# Patient Record
Sex: Male | Born: 1950 | Race: White | Hispanic: No | Marital: Married | Smoking: Former smoker
Health system: Southern US, Community
[De-identification: ages and names within clinical notes are randomized; demographics above are authoritative.]

## PROBLEM LIST (undated history)

## (undated) DIAGNOSIS — G4730 Sleep apnea, unspecified: Secondary | ICD-10-CM

## (undated) DIAGNOSIS — E669 Obesity, unspecified: Secondary | ICD-10-CM

## (undated) DIAGNOSIS — I10 Essential (primary) hypertension: Secondary | ICD-10-CM

## (undated) DIAGNOSIS — I4891 Unspecified atrial fibrillation: Secondary | ICD-10-CM

## (undated) DIAGNOSIS — E785 Hyperlipidemia, unspecified: Secondary | ICD-10-CM

## (undated) DIAGNOSIS — D126 Benign neoplasm of colon, unspecified: Secondary | ICD-10-CM

## (undated) HISTORY — DX: Essential (primary) hypertension: I10

## (undated) HISTORY — DX: Unspecified atrial fibrillation: I48.91

## (undated) HISTORY — DX: Obesity, unspecified: E66.9

## (undated) HISTORY — DX: Sleep apnea, unspecified: G47.30

## (undated) HISTORY — PX: APPENDECTOMY: SHX54

## (undated) HISTORY — PX: COLONOSCOPY: SHX174

## (undated) HISTORY — DX: Hyperlipidemia, unspecified: E78.5

## (undated) HISTORY — DX: Benign neoplasm of colon, unspecified: D12.6

---

## 1997-12-29 ENCOUNTER — Ambulatory Visit (HOSPITAL_COMMUNITY): Admission: RE | Admit: 1997-12-29 | Discharge: 1997-12-29 | Payer: Self-pay | Admitting: Urology

## 2002-03-21 ENCOUNTER — Encounter: Payer: Self-pay | Admitting: Internal Medicine

## 2002-03-21 ENCOUNTER — Ambulatory Visit (HOSPITAL_COMMUNITY): Admission: RE | Admit: 2002-03-21 | Discharge: 2002-03-21 | Payer: Self-pay | Admitting: Internal Medicine

## 2002-03-24 ENCOUNTER — Encounter: Admission: RE | Admit: 2002-03-24 | Discharge: 2002-03-24 | Payer: Self-pay | Admitting: Urology

## 2002-03-24 ENCOUNTER — Encounter: Payer: Self-pay | Admitting: Urology

## 2002-03-27 ENCOUNTER — Ambulatory Visit (HOSPITAL_BASED_OUTPATIENT_CLINIC_OR_DEPARTMENT_OTHER): Admission: RE | Admit: 2002-03-27 | Discharge: 2002-03-27 | Payer: Self-pay | Admitting: Urology

## 2002-03-31 ENCOUNTER — Encounter: Payer: Self-pay | Admitting: Urology

## 2002-03-31 ENCOUNTER — Encounter: Admission: RE | Admit: 2002-03-31 | Discharge: 2002-03-31 | Payer: Self-pay | Admitting: Urology

## 2005-03-25 ENCOUNTER — Encounter (INDEPENDENT_AMBULATORY_CARE_PROVIDER_SITE_OTHER): Payer: Self-pay | Admitting: *Deleted

## 2005-03-25 ENCOUNTER — Inpatient Hospital Stay (HOSPITAL_COMMUNITY): Admission: EM | Admit: 2005-03-25 | Discharge: 2005-03-27 | Payer: Self-pay | Admitting: Emergency Medicine

## 2006-12-17 IMAGING — CR DG ABDOMEN ACUTE W/ 1V CHEST
4 series · 4 of 4 positions shown · non-contrast
Comparison: none

CLINICAL DATA: Right lower quadrant pain.
 ABDOMEN - 2 VIEW & CHEST - 1 VIEW: 
 The chest x-ray shows mild bibasilar atelectasis.  The lungs are otherwise clear.  
 Flat and upright views of the abdomen reveal a normal bowel gas pattern with no obstruction or free air.  An undissolved tablet is noted in the colon.  There are no renal calculi.

[w chest pa]
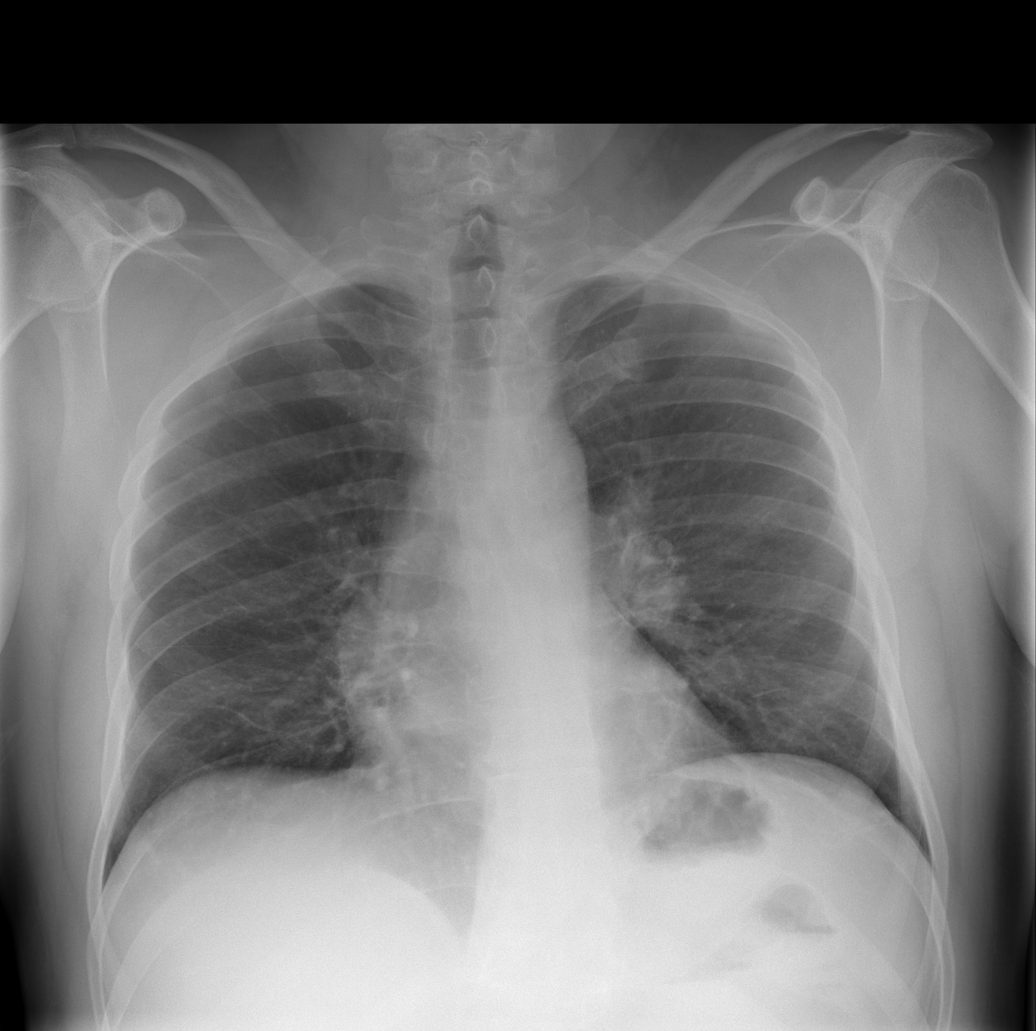

[w abdomen upright *]
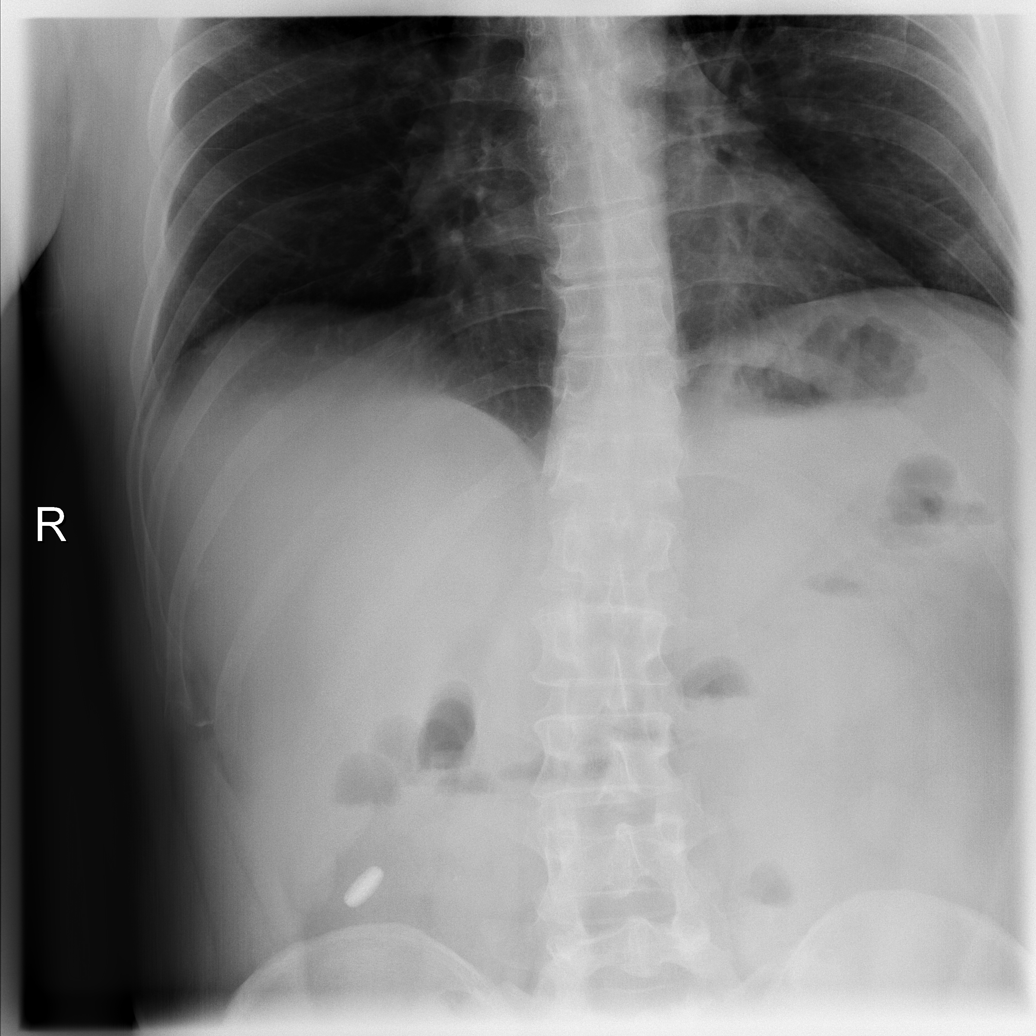

[t abdomen supine (1 of 2)]
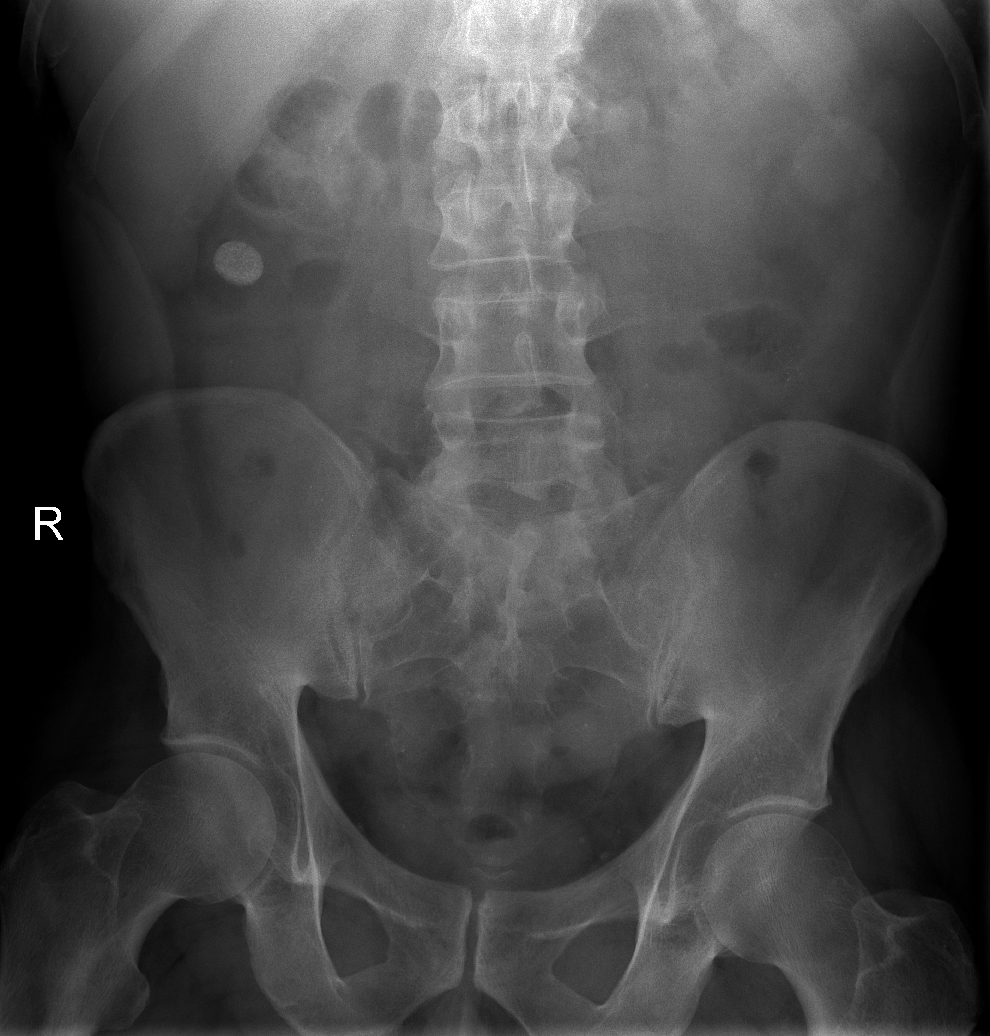

[t abdomen supine (2 of 2)]
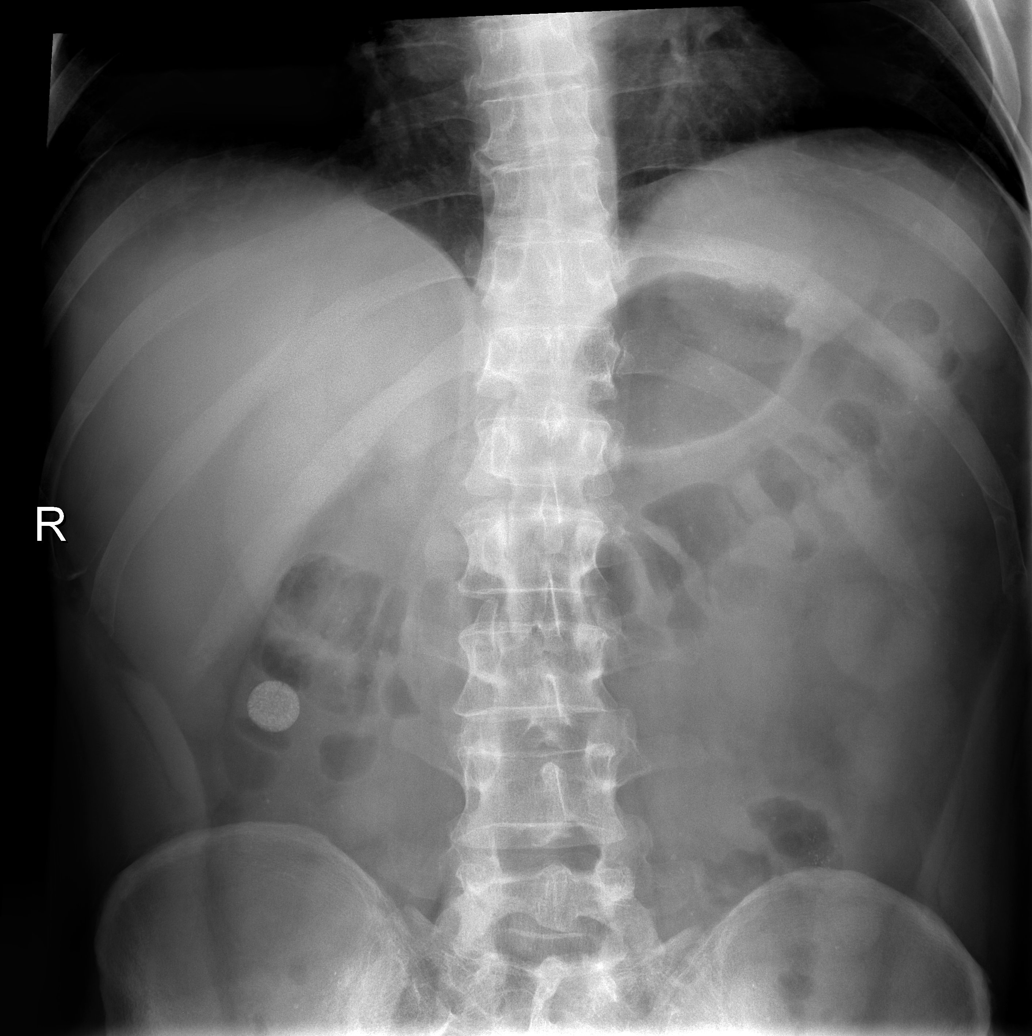

[4 of 4 positions shown; findings below may reference images not displayed]

IMPRESSION: Mild bibasilar atelectasis.  No acute abnormality.

## 2007-08-06 ENCOUNTER — Ambulatory Visit: Payer: Self-pay | Admitting: Cardiology

## 2007-08-06 ENCOUNTER — Emergency Department (HOSPITAL_COMMUNITY): Admission: EM | Admit: 2007-08-06 | Discharge: 2007-08-06 | Payer: Self-pay | Admitting: Emergency Medicine

## 2007-08-13 ENCOUNTER — Ambulatory Visit: Payer: Self-pay

## 2007-08-23 ENCOUNTER — Ambulatory Visit: Payer: Self-pay | Admitting: Cardiology

## 2009-04-29 IMAGING — CR DG CHEST 1V PORT
1 series · 1 of 1 positions shown · non-contrast
Comparison: None.

CLINICAL DATA: Chest pain.
 PORTABLE CHEST ? 1 VIEW:

[view not recorded]
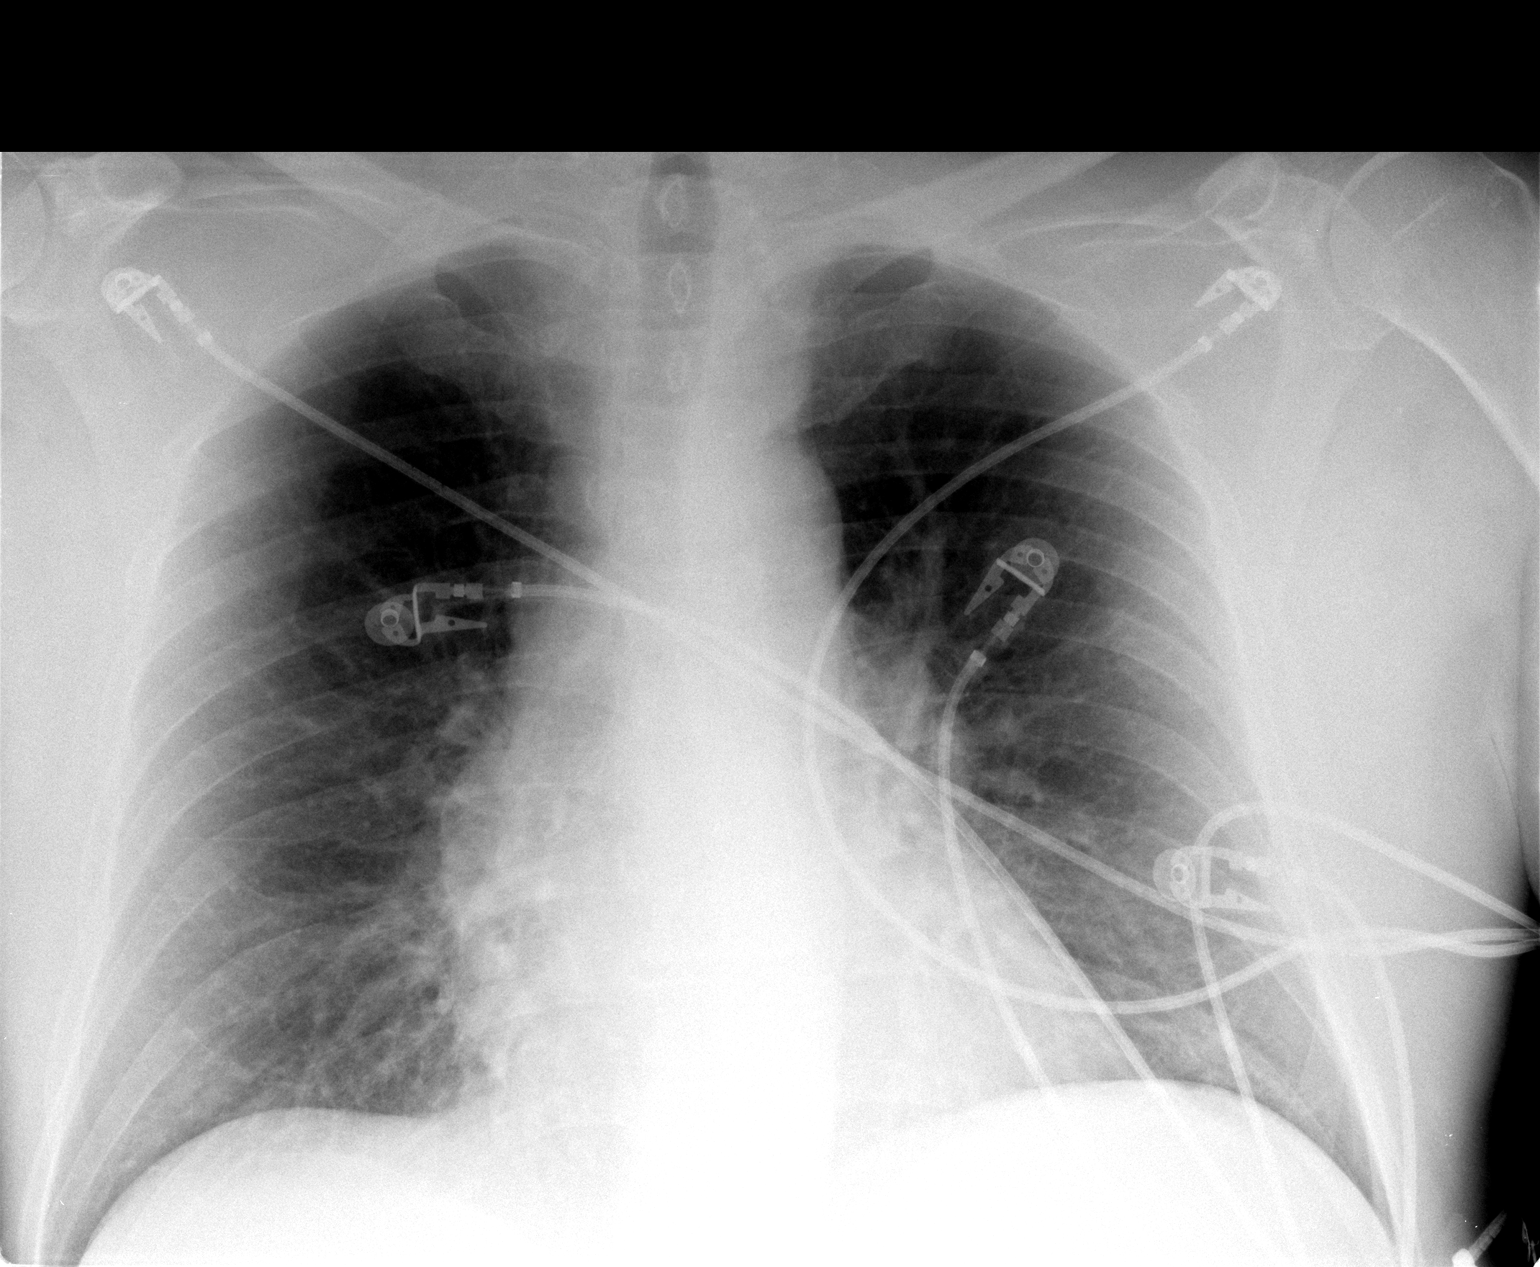

[1 of 1 positions shown; findings below may reference images not displayed]

FINDINGS: Heart and mediastinum are within normal limits for portable technique.  No congestive heart failure.  Lungs are clear.  Osseous structures are intact in one view.
IMPRESSION: No active disease.

## 2011-01-24 NOTE — Consult Note (Signed)
NAME:  Michael Buck, Michael Buck NO.:  [id number]   MEDICAL RECORD NO.:  [id number]          PATIENT TYPE:  EMS   LOCATION:  MAJO                         FACILITY:  [facility]   PHYSICIAN:  Luis Abed, MD, FACCDATE OF BIRTH:  1951/05/28   DATE OF CONSULTATION:  DATE OF DISCHARGE:                                 CONSULTATION   REVIEW OF HISTORY:  Michael Buck is a 60 year old white male who was  referred to [facility] Emergency Room secondary to chest discomfort.   On Sunday morning, at approximately 8 o'clock while reaching to put a  cup of coffee in the microwave, he suddenly developed sharp left  anterior upper chest discomfort, radiating to his left shoulder blade  and left arm.  He denied shortness of breath, but he stated it was  difficulty to take a deep breath.  He specifically denied any nausea,  vomiting or diaphoresis.  Initially, it was a 10 on a scale of 0 to 10.  He sat down and restarted and within less than 5 minutes, the discomfort  was a 3 and he continued on with his usual activities over the next  several days.  The discomfort has been persistent since Sunday morning,  although times he is unaware of the discomfort because he does not pay  any attention to it, but he thinks overall the last time his discomfort  was a 0 was prior to onset on Sunday.  He does notice residual  discomfort with moving his upper extremities in the left shoulder blade.  He did take an aspirin on Sunday evening and did not notice any specific  changes.  He denies prior occurrences.   ALLERGIES:  NO KNOWN DRUG ALLERGIES.   MEDICATIONS PRIOR TO ADMISSION:  Did not include any prescriptions.   PAST MEDICAL HISTORY:  Notable for no prior cardiac evaluation.  He  specifically denies any hypertension, diabetes, chronic obstructive  pulmonary disease, cerebrovascular accident, myocardial infarction,  blood dyscrasia, thyroid dysfunction and he does not know his  cholesterol  status.   PAST SURGICAL HISTORY:  Notable for an appendectomy.   SOCIAL HISTORY:  He resides in [facility] [place] [facility] with his wife.  They have 1  child, no grandchildren.  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  He smokes 1-1/2 packs per day for the  past 40 years.  Denies any alcohol, drugs, herbal medications, specific  diet or exercise program.   FAMILY HISTORY:  His mother died at the age of 35 with a questionable  history of myocardial infarction, definite history of CVA.  His father  died at age 51 with an MI, history of hypertension and diabetes.  He has  1 brother deceased at age 59, secondary to suicide.  Sister is deceased  at age 68 with cancer.  He has 1 brother, 69, with a history of  hypertension.   REVIEW OF HISTORY:  In addition to the above, is notable for reading  glasses, hearing loss although he has not had any testing and he does  not wear hearing  aides, dentures, cough productive of brownish sputum,  wheezing, he does snore he has never been evaluated for sleep apnea.  All other systems unremarkable, except as noted above.   PHYSICAL EXAMINATION:  GENERAL:  Well-nourished, well-developed,  pleasant white male in no apparent distress.  Wife is present.  Also,  boss is present.  VITAL SIGNS:  Temperature 97.1.  Blood pressure 144/79.  Pulse 55.  Respirations 14, 98% saturation on room air.  HEENT:  Unremarkable, except for edentulous.  NECK:  Supple without thyromegaly, adenopathy, JVD or carotid bruits.  CHEST:  Symmetrical excursion.  Clear to auscultation without rales,  rhonchi or wheezing.  He does have diminished breath sounds throughout.  HEART:  PMI is not displaced.  Regular rate and rhythm.  I did not  appreciate any murmurs, rubs, clicks or gallops.  All pulses are  symmetrical and intact without abdominal or femoral bruits.  Skin  integument was intact.  ABDOMEN:  Obese.  Bowel sounds present.  Nontender without  organomegaly  or masses.  EXTREMITIES:  Negative cyanosis, clubbing or edema.  MUSCULOSKELETAL:  Unremarkable, except his discomfort is reproduced in  the left shoulder blade with extension of both arms.  NEURO:  Unremarkable.   LABORATORY DATA:  Chest x-ray shows no active disease.  EKG shows normal  sinus rhythm, left axis deviation.  No acute changes.  No apparent  change from old EKG in July of 2008.  H&H is 15.3 and 45.3.  normal  indices.  Platelets 266.  WBC 11.5.  PT 12.8, PTT 23.  Sodium 137,  potassium 3.8, BUN 13, creatinine 0.78, glucose 92.  Normal LFTs.  CK-MB  is 104 and 2.0 and troponins 0.01.  Hemoglobin A1c and TSH are pending  at the time of this dictation.   IMPRESSION:  1. Prolonged atypical chest discomfort.  2. Hypertension.  As initial presentation to urgent care clinic showed      a blood pressure of 179/97; however, now improved.  3. Tobacco use.  4. Obesity.  5. Probable hyperlipidemia.   DISPOSITION:  Dr. Myrtis Ser reviewed the patient's history, spoke with and  examined the patient.  Given his EKG, negative enzymes with a 3 day  history of consistent chest discomfort, it was felt that his discomfort  is not cardiac in etiology.  We have arranged an outpatient stress  Myoview for August 13, 2007 at 9:15 and follow up with Dr. Myrtis Ser on  August 21, 2007 at 11:45.  I have asked him to begin a blood pressure  diary for continuing monitoring, as well as begin a baby aspirin 81 mg  daily.  We have counseled him on regular exercise, to follow a low-salt,  low-fat diet and tobacco cessation.  He will be discharged from the  emergency room with the above-mentioned followup.      Joellyn Rued, PA-C      Luis Abed, MD, [facility]  Electronically Signed    EW/MEDQ  D:  08/06/2007  T:  08/07/2007  Job:  [id number]   cc:   Harrel Lemon. Merla Riches, M.D.

## 2011-01-24 NOTE — Assessment & Plan Note (Signed)
[facility]                          [facility] NOTE   NAME:Michael Buck, Michael Buck                      MRN:          [id number]  DATE:08/23/2007                            DOB:          July 18, 1951    I saw Michael Buck for cardiology consultation at [facility]. He  had been sent to [place] from Dr. Merla Riches at urgent care. The patient had  chest discomfort. There is a complete consultation note in the chart.  His blood pressure was elevated when he was first seen. It improved once  he was at the hospital. He smokes and continues to smoke and we talked  about this. He is overweight.   In the hospital, there was no evidence of MI. We arranged for him to  have an outpatient stress test on 08/13/2007 that was done in the  [place] office. The patient exercised 9 minutes and 15 seconds. He  did have some chest tightness. He did have a hypertensive blood pressure  response. Ejection fraction was normal. There was no sign of scar or  ischemia.   Michael Buck returns today and he is feeling well. He is not having any  recurring problems.   PAST MEDICAL HISTORY:   ALLERGIES:  No known drug allergies.   MEDICATIONS:  1. Aspirin.  2. Multivitamin.   OTHER MEDICAL PROBLEMS:  See the list below.   REVIEW OF SYSTEMS:  He feels well today and his review of systems is  negative. He is not having any recurrent chest pain. Otherwise, his  review of systems is negative.   PHYSICAL EXAMINATION:  VITAL SIGNS:  Blood pressure 150/66, pulse 66,  weight 232 pounds.  GENERAL:  The patient is oriented to person, time, and place. Affect is  normal.  HEENT:  Reveals no xanthelasma. He has normal extraocular motion. There  are no carotid bruits. There is no jugular venous distension.  CARDIAC:  S1 with an S2.  EXTREMITIES:  He has no peripheral edema.   No other labs are done today.   I discussed the situation carefully with Michael Buck. I spoke with  him  specifically about stopping smoking. He said that he will try to cut  down, but he did not commit to anything else. He needs careful follow up  of his blood pressure. I was prepared to begin a blood pressure medicine  today, as I do believe that he needs blood pressure treated. He was  somewhat hesitant and would like to follow along with Dr. Merla Riches.   PROBLEMS:  1. Chest discomfort. No proof of coronary ischemia. A Cardiolite      Myoview scan showing no ischemia.  2. Mild hypertension with a hypertensive response to stress. I do      think that he should have his blood pressure followed and if it      remains elevated, it should be treated.  3. Tobacco use. He is strongly encouraged to stop smoking.  4. Increased weight. Weight loss would be optimal and follow up of his      lipids would be optimal.  I have encouraged him to see Dr. Merla Riches for follow up. No further  formal cardiac workup at this time.     Luis Abed, MD, [facility]  Electronically Signed    JDK/MedQ  DD: 08/23/2007  DT: 08/24/2007  Job #: [id number]   cc:   Harrel Lemon. Merla Riches, M.D.

## 2011-01-27 NOTE — Op Note (Signed)
[facility]  Patient:    Michael Buck, Michael Buck Visit Number: [id number] MRN: [id number]          Service Type: NES Location: NESC Attending Physician:  Laqueta Jean Dictated by:   Vonzell Schlatter Patsi Sears, M.D. Proc. Date: 03/27/02 Admit Date:  03/27/2002 Discharge Date: 03/27/2002                             Operative Report  PREOPERATIVE DIAGNOSIS:  Left ureterocolic.  POSTOPERATIVE DIAGNOSIS:  Left ureterocolic.  OPERATION PERFORMED:  Cystourethroscopy, left retrograde pyelogram, balloon dilation of the left lower ureter, ureteroscopy basket extraction of a left mid ureteral stone.  SURGEON:  Sigmund I. Patsi Sears, M.D.  ANESTHESIA:  General (LMA).  PREPARATION:  After appropriate preanesthesia, the patient was brought to the operating room, placed on the operating table in the dorsal supine position where general LMA anesthesia was introduced. He was then replaced in the dorsal lithotomy position where the pubis was prepped with Betadine solution and draped in the usual fashion.  DESCRIPTION OF PROCEDURE:  Cystourethroscopy was accomplished. Review of the patients x-rays showed that the patient had possible phleboliths versus possible left lower ureteral calculus. The patient has had no relief of his left flank and left lower quadrant pain. Therefore, he is for ureteroscopy and basket extraction of possible stone.  Cystoscopy revealed a normal-appearing bladder, with a very small left ureteral orifice. A guidewire was passed into the ureteral orifice, and a 4 cm balloon dilation was accomplished, with retrograde pyelography, showing that the two pelvic calcifications appear to be outside the ureter. Direct vision ureteroscopy was then accomplished with the 6.5 [place] scope, and indeed, the calcifications seen on KUB were phleboliths. However, ureteroscopy more proximal, in the mid ureter, showed an area of tight narrowing,  and manipulation showed that a stone, which had grown into the wall. This was unroofed and excised in pieces. The stone did not appear on retrograde pyelogram at all, and the dark colored stone appeared quartzlike when excised. It will be sent for analysis.  It was elected to leave a double J catheter in place, and a 6 x 26 double J catheter was then passed over the guidewire into the renal pelvis, coiled in the renal pelvis and in the bladder. The patient tolerated the procedure well, and was awakened and taken to the recovery room in good condition. It is noted that the patient received Xylocaine within the ureter, as well as IV Toradol, and at the beginning of the case, had a B&O suppository. Dictated by:   Vonzell Schlatter Patsi Sears, M.D. Attending Physician:  Laqueta Jean DD:  03/27/02 TD:  04/01/02 Job: [id number]

## 2011-01-27 NOTE — H&P (Signed)
NAME:  Michael Buck, TATSCH NO.:  [id number]   MEDICAL RECORD NO.:  [id number]          PATIENT TYPE:  INP   LOCATION:  0101                         FACILITY:  [facility]   PHYSICIAN:  Anselm Pancoast. Weatherly, M.D.DATE OF BIRTH:  [birth year]   DATE OF ADMISSION:  03/25/2005  DATE OF DISCHARGE:                                HISTORY & PHYSICAL   CHIEF COMPLAINT:  Severe abdominal pain, right side.   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 He went to bed last night and  started having kind of indigestion, definitely pain about early a.m. Got up  and took some Pepto Bismol and tried to take some stuff to soothe his  stomach. Felt somewhat better and then shortly afterwards, was obviously  feeling worse. He went to [facility] and seen by Dr. Merla Riches who  found him significantly tender on the abdomen, predominantly the right lower  quadrant with a mildly elevated temperature. White count was 25,600 and a  urinalysis was essentially negative with a few red cells. The patient has  had previous kidney stones but no other serious problem and Dr. Merla Riches  called me. This was about probably 6:00 and said that he thought the patient  obviously had appendicitis and did not think that the patient needed a CT. I  said send him on over to the emergency room and I saw him. I was in  agreement on his physical examination. He was definitely very tender in the  lower right abdomen and did obtain an acute abdomen plain series, just to  make sure that we did not have any free air. He was markedly tender with  muscle guarding in the right lower abdomen and really denied having any  definite symptoms of abdominal pain yesterday. I had started an IV and given  him 3 grams of Unasyn and discussed with him, that hopefully this was acute  appendicitis, possibly ruptured and we discussed the options that we may  have a problem with diverticulitis. He said that the pain he originally  noted yesterday night, really was kind of more generalized, upper abdomen  and then shifted to the lower abdomen this morning. He was tender, kind of  right at the anterior iliac crest area and hopefully, this is a retrocecal  appendix.   REVIEW OF SYSTEMS:  No hypertension. No history of angina. I am not sure  when he has had a last chest x-ray.   ALLERGIES:  Denies allergies.   PAST SURGICAL HISTORY:  He has had the kidney stones.   PHYSICAL EXAMINATION:  GENERAL:  He is a husky, healthy appearing male who  appears very uncomfortable because of the pain.  VITAL SIGNS:  At the urgent care, he had a low grade temperature and here,  his temperature was 101.3, pulse was 85 and respiratory rate 24.  HEENT:  He appears adequately hydrated.  LUNGS:  Good breath sounds bilaterally.  CARDIAC:  Normal sinus rhythm.  ABDOMEN:  He has got decreased bowel sounds with muscle guarding in the  right lower quadrant with rebound. He is not really tender in the upper  abdomen. It is predominantly left. Vaguely tender in the left lower  quadrant.  RECTAL/PELVIC:  Did not do an examination on him.  GENITOURINARY:  He has had no symptoms of burning, stinging, or difficulty  with his urine.  EXTREMITIES:  Unremarkable.  NEUROLOGIC:  Physiologic.   IMPRESSION/PLAN:  Acute surgical abdomen. Hopefully this is appendicitis  with fear it might be ruptured and we discussed the options of possible  diverticulitis. I plan on proceeding on with an urgent appendectomy, prefer  open.       WJW/MEDQ  D:  03/25/2005  T:  03/25/2005  Job:  [id number]   cc:   Harrel Lemon. Merla Riches, M.D.  [address]  [place]  [place] [address]  Fax: [phone]

## 2011-01-27 NOTE — Op Note (Signed)
NAME:  Michael Buck NO.:  [id number]   MEDICAL RECORD NO.:  [id number]          PATIENT TYPE:  INP   LOCATION:  1421                         FACILITY:  [facility]   PHYSICIAN:  Anselm Pancoast. Weatherly, M.D.DATE OF BIRTH:  [birth year]   DATE OF PROCEDURE:  03/25/2005  DATE OF DISCHARGE:                                 OPERATIVE REPORT   PREOPERATIVE DIAGNOSIS:  Acute appendicitis, possibly ruptured.   POSTOPERATIVE DIAGNOSIS:  Acute retrocecal gangrenous appendicitis, don't  think it is ruptured.   ANESTHESIA:  General.   SURGEON:  Consuello Bossier, M.D.   HISTORY:  Michael Buck is a 60 year old male who said he felt fine  yesterday and then last night started having kind of indigestion, more  severe during the evening/night and then fairly marked pain this morning and  it became worse during the day with shifting to the right lower quadrant.  He was seen in Urgent Care by Dr. Merla Riches who thought this was an  appendicitis.  His white count was 25,000 and he is febrile.  He is very  tender in the right lower quadrant.  I did an acute abdominal series that  did not show any free air.  You could see where he has had some Pepto-Bismol  tablets and hope this is an appendicitis; fear it may be ruptured.  The  patient has given 3 g of Unasyn.  He has had a little fluid bolus and is  going to undergo appendectomy.   The patient was taken to the operative suite, induction of general  anesthesia, general endotracheal tube and the abdomen was prepped with  Betadine solution and draped in  a sterile manner.  I made a little  transverse [place] incision just right at the anterior iliac crest where  his pain was most localized.  Sharp dissection and through the adipose  tissue, the external oblique, internal oblique and then carefully entered  into the peritoneal cavity.  There was a little bit of turbid fluid in this  area.  You could feel an inflammatory mass right  behind the edge of the  cecum and you could see part of the appendix.  I had the nurse pull kind of  vigorous on the retractors so I could kind of open up on the peritoneum a  little laterally and try to work this gangrenous appendix up into the field.  The appendiceal mesentery was divided between [facility] and ligated with 2-0  Vicryl.  The appendix was inflamed markedly right on down to its base.  Across the base with a right-angle, we tied it with 2-0 Vicryl and then  removed the gangrenous appendix.  The pursestring suture with 3-0 silk was  placed.  The first tie looked like it was not holding well and I put a  second tie right under it removing the first one.  This was with 2-0 Vicryl.  The appendiceal stump was inverted, pursestring suture tied and I put a Z-  stitch on top for kind of reinforcing the little pursestring suture closure.  Next, the area was thoroughly irrigated.  There  was no evidence of bleeding.  The cecum of course had not been brought up to the skin level.  Then the  wound was closed after correct sponge count.  The peritoneum and transverse  cystitis and running 2-0 Vicryl.  The internal blade was approximated with  interrupted 2-0 Vicryl and the external oblique was closed with interrupted  2-0 Vicryl.  The Scarpa's fascia was irrigated and closed with a few sutures  of 4-0 Vicryl and a couple 4-0 Vicryl sutures with Benzoin and Steri-Strips  of the skin.  He has about a 2 to  2-1/2 inch incision and I am planning on keeping him on antibiotics for  probably about 24 hours.  I will repeat a white count and mid-morning  tomorrow if he still has a markedly elevated white count and feels well  enough to go home I will probably place him on oral antibiotics for a few  days.       WJW/MEDQ  D:  03/25/2005  T:  03/26/2005  Job:  [id number]   cc:   Dr. Merla Riches

## 2011-06-20 LAB — CBC
HCT: 45.3
Hemoglobin: 15.3
MCHC: 33.8
MCV: 90.4
Platelets: 266
RBC: 5.01
RDW: 13.2
WBC: 11.5 — ABNORMAL HIGH

## 2011-06-20 LAB — COMPREHENSIVE METABOLIC PANEL
ALT: 18
AST: 19
Albumin: 3.7
Alkaline Phosphatase: 62
GFR calc Af Amer: >60
Glucose, Bld: 92
Potassium: 3.8
Sodium: 137
Total Protein: 6.4

## 2011-06-20 LAB — HEMOGLOBIN A1C: Hgb A1c MFr Bld: 5.7

## 2011-06-20 LAB — TSH: TSH: 3.561

## 2011-06-20 LAB — TROPONIN I: Troponin I: 0.01

## 2011-06-20 LAB — PROTIME-INR: INR: 0.9

## 2011-06-20 LAB — CK TOTAL AND CKMB (NOT AT ARMC): CK, MB: 2

## 2011-06-20 LAB — APTT: aPTT: 33

## 2011-07-24 ENCOUNTER — Encounter: Payer: Self-pay | Admitting: Gastroenterology

## 2011-08-14 ENCOUNTER — Ambulatory Visit (INDEPENDENT_AMBULATORY_CARE_PROVIDER_SITE_OTHER): Payer: BC Managed Care – PPO | Admitting: Gastroenterology

## 2011-08-14 ENCOUNTER — Encounter: Payer: Self-pay | Admitting: Gastroenterology

## 2011-08-14 VITALS — BP 136/72 | HR 60 | Ht 72.0 in | Wt 257.0 lb

## 2011-08-14 DIAGNOSIS — R195 Other fecal abnormalities: Secondary | ICD-10-CM

## 2011-08-14 MED ORDER — PEG-KCL-NACL-NASULF-NA ASC-C 100 G PO SOLR: 1.0000 | Freq: Once | ORAL | Status: DC

## 2011-08-14 NOTE — Patient Instructions (Addendum)
You have been scheduled for a Colonoscopy. See separate instructions.  Pick up your prep kit from your pharmacy.  cc: Ellamae Sia, MD

## 2011-08-14 NOTE — Progress Notes (Signed)
History of Present Illness: This is a 60 year old male who was found to have a Hemosure positive stools. He relates very rare episodes of postprandial reflux. He is also noted occasional hemorrhoidal symptoms maybe occurring once every few years. A CBC obtained in October was unremarkable. Denies weight loss, abdominal pain, constipation, diarrhea, change in stool caliber, melena, hematochezia, nausea, vomiting, dysphagia,chest pain.    No Known Allergies No outpatient prescriptions prior to visit.   Past Medical History  Diagnosis Date  . Hypertension   . Obesity    Past Surgical History  Procedure Date  . Appendectomy    History   Social History  . Marital Status: Married    Spouse Name: N/A    Number of Children: 1  . Years of Education: N/A   Occupational History  . Electrician     Engineer, drilling    Social History Main Topics  . Smoking status: Former Games developer  . Smokeless tobacco: Never Used  . Alcohol Use: Yes     occ beer   . Drug Use: No  . Sexually Active: None   Other Topics Concern  . None   Social History Narrative   Daily caffeine    Family History  Problem Relation Age of Onset  . Stroke Mother   . Hypertension Father   . Diabetes Father   . Heart attack Father   . Colon cancer Neg Hx     Review of Systems: Pertinent positive and negative review of systems were noted in the above HPI section. All other review of systems were otherwise negative.   Physical Exam: General: Well developed , well nourished, no acute distress Head: Normocephalic and atraumatic Eyes:  sclerae anicteric, EOMI Ears: Normal auditory acuity Mouth: No deformity or lesions Neck: Supple, no masses or thyromegaly Lungs: Clear throughout to auscultation Heart: Regular rate and rhythm; no murmurs, rubs or bruits Abdomen: Soft, non tender and non distended. No masses, hepatosplenomegaly or hernias noted. Normal Bowel sounds Rectal: Deferred to colonoscopy  Musculoskeletal:  Symmetrical with no gross deformities  Skin: No lesions on visible extremities Pulses:  Normal pulses noted Extremities: No clubbing, cyanosis, edema or deformities noted Neurological: Alert oriented x 4, grossly nonfocal Cervical Nodes:  No significant cervical adenopathy Inguinal Nodes: No significant inguinal adenopathy Psychological:  Alert and cooperative. Normal mood and affect  Assessment and Recommendations:  1. Asymptomatic Hemoccult-positive stool. Rule out colorectal lesions such as neoplasms and AVMs. The risks, benefits, and alternatives to colonoscopy with possible biopsy and possible polypectomy were discussed with the patient and they consent to proceed.

## 2011-09-11 ENCOUNTER — Telehealth: Payer: Self-pay | Admitting: Gastroenterology

## 2011-09-11 DIAGNOSIS — R195 Other fecal abnormalities: Secondary | ICD-10-CM

## 2011-09-11 MED ORDER — PEG-KCL-NACL-NASULF-NA ASC-C 100 G PO SOLR: 1.0000 | Freq: Once | ORAL | Status: DC

## 2011-09-11 NOTE — Telephone Encounter (Signed)
Resent Movi prep to [facility] in [place].

## 2011-09-20 ENCOUNTER — Telehealth: Payer: Self-pay | Admitting: *Deleted

## 2011-09-20 NOTE — Telephone Encounter (Signed)
Pt is calling to say that he mixed prep today 1/9.  Procedure scheduled for 1/11.  Pt will come by office and pick up MoviPrep sample. Michael Buck

## 2011-09-22 ENCOUNTER — Encounter: Payer: Self-pay | Admitting: Gastroenterology

## 2011-09-22 ENCOUNTER — Ambulatory Visit (AMBULATORY_SURGERY_CENTER): Payer: BC Managed Care – PPO | Admitting: Gastroenterology

## 2011-09-22 VITALS — BP 141/78 | HR 60 | Temp 97.3°F | Resp 17 | Ht 72.0 in | Wt 257.0 lb

## 2011-09-22 DIAGNOSIS — D126 Benign neoplasm of colon, unspecified: Secondary | ICD-10-CM

## 2011-09-22 DIAGNOSIS — R195 Other fecal abnormalities: Secondary | ICD-10-CM

## 2011-09-22 MED ORDER — SODIUM CHLORIDE 0.9 % IV SOLN: 500.0000 mL | INTRAVENOUS | Status: DC

## 2011-09-22 NOTE — Op Note (Addendum)
 [facility] [address] [place], [place]  [address]  COLONOSCOPY PROCEDURE REPORT  PATIENT:  Michael Buck, Michael Buck  MR#:  [id number] BIRTHDATE:  1951/04/02, 60 yrs. old  GENDER:  male ENDOSCOPIST:  Judie Petit T. Russella Dar, MD, [facility] Referred by:  Ellamae Sia, M.D. PROCEDURE DATE:  09/22/2011 PROCEDURE:  Colonoscopy with hot biopsy and snare polypectomy ASA CLASS:  Class II INDICATIONS:  1) heme positive stool MEDICATIONS:   These medications were titrated to patient response per physician's verbal order, Fentanyl 75 mcg IV, Versed 9 mg IV DESCRIPTION OF PROCEDURE:   After the risks benefits and alternatives of the procedure were thoroughly explained, informed consent was obtained.  Digital rectal exam was performed and revealed no abnormalities.   The [id number] endoscope was introduced through the anus and advanced to the cecum, which was identified by both the appendix and ileocecal valve, without limitations.  The quality of the prep was good, using MoviPrep. The instrument was then slowly withdrawn as the colon was fully examined. <<PROCEDUREIMAGES>> FINDINGS:  A sessile polyp was found in the ascending colon. It was 6 mm in size. Polyp was snared without cautery. Retrieval was successful. A sessile polyp was found in the mid transverse colon. It was 7 mm in size. Polyp was snared without cautery. Retrieval was successful. Two polyps were found in the sigmoid colon. They were 4 - 5 mm in size. With hot biopsy forceps, the polyps were cauterized and biopsies were obtained and sent to pathology.  A sessile polyp was found in the rectum. It was 5 mm in size. Polyp was snared without cautery. Retrieval was successful.  Otherwise normal colonoscopy without other polyps, masses, vascular ectasias, or inflammatory changes.  Retroflexed views in the rectum revealed no abnormalities.  The time to cecum =  1.5 minutes. The scope was then withdrawn (time =  12.5  min) from  the patient and the procedure completed.  COMPLICATIONS:  None  ENDOSCOPIC IMPRESSION: 1) 6 mm sessile polyp in the ascending colon 2) 7 mm sessile polyp in the mid transverse colon 3) 4 - 5 mm Two polyps in the sigmoid colon 4) 5 mm sessile polyp in the rectum  RECOMMENDATIONS: 1) Hold aspirin, aspirin products, and anti-inflammatory medication for 2 weeks. 2) Await pathology results 3) If 3 or more of the polyps are adenomatous (pre-cancerous), colonoscopy in 3 years. If 1 or 2 are adenomatous, colonoscopy in 5 years. Otherwise you should continue to follow colorectal cancer screening guidelines for "routine risk" patients with a colonoscopy in 10 years.  Venita Lick. Russella Dar, MD, [facility]  n. REVISED:  09/22/2011 10:07 AM eSIGNED:   Venita Lick. Pheng Prokop at 09/22/2011 10:07 AM  Theola Sequin, [phone]

## 2011-09-22 NOTE — Patient Instructions (Signed)
Please follow discharge instructions given today. Colon polyps x5 removed today, you will receive result letter in your mail in 1-2 weeks. Letter will have any further recommendations from Dr.Stark. Hold all aspirin, aspirin products, and anti-inflammatory medications for 2 weeks. May use tylenol. May resume current medications. Handouts given today on polyps. Call us with any questions or concerns. Thank you!!

## 2011-09-22 NOTE — Progress Notes (Signed)
Patient did not experience any of the following events: a burn prior to discharge; a fall within the facility; wrong site/side/patient/procedure/implant event; or a hospital transfer or hospital admission upon discharge from the facility. (G8907) Patient did not have preoperative order for IV antibiotic SSI prophylaxis. (G8918)  

## 2011-09-25 ENCOUNTER — Telehealth: Payer: Self-pay | Admitting: *Deleted

## 2011-09-25 NOTE — Telephone Encounter (Signed)
Attempted to contact let ring more than 5 times no answer at #.

## 2011-09-26 ENCOUNTER — Encounter: Payer: Self-pay | Admitting: Gastroenterology

## 2011-11-14 ENCOUNTER — Ambulatory Visit: Payer: Self-pay

## 2011-12-10 ENCOUNTER — Encounter: Payer: Self-pay | Admitting: Family Medicine

## 2011-12-10 ENCOUNTER — Ambulatory Visit (INDEPENDENT_AMBULATORY_CARE_PROVIDER_SITE_OTHER): Payer: BC Managed Care – PPO | Admitting: Family Medicine

## 2011-12-10 VITALS — BP 141/90 | HR 84 | Temp 99.4°F | Resp 16 | Ht 72.0 in | Wt 261.6 lb

## 2011-12-10 DIAGNOSIS — J45909 Unspecified asthma, uncomplicated: Secondary | ICD-10-CM

## 2011-12-10 MED ORDER — IPRATROPIUM BROMIDE 0.02 % IN SOLN: 0.5000 mg | Freq: Once | RESPIRATORY_TRACT | Status: DC

## 2011-12-10 MED ORDER — AZITHROMYCIN 250 MG PO TABS: ORAL_TABLET | ORAL | Status: AC

## 2011-12-10 MED ORDER — ALBUTEROL SULFATE HFA 108 (90 BASE) MCG/ACT IN AERS: 1.0000 | INHALATION_SPRAY | Freq: Four times a day (QID) | RESPIRATORY_TRACT | Status: DC | PRN

## 2011-12-10 MED ORDER — HYDROCODONE-HOMATROPINE 5-1.5 MG/5ML PO SYRP: 5.0000 mL | ORAL_SOLUTION | Freq: Three times a day (TID) | ORAL | Status: AC | PRN

## 2011-12-10 MED ORDER — ALBUTEROL SULFATE (2.5 MG/3ML) 0.083% IN NEBU: 2.5000 mg | INHALATION_SOLUTION | Freq: Once | RESPIRATORY_TRACT | Status: DC

## 2011-12-10 NOTE — Progress Notes (Signed)
  Subjective:    Patient ID: Michael Buck, male    DOB: 22-Mar-1951, 61 y.o.   MRN: [id number]  Cough This is a new problem. The current episode started in the past 7 days. The problem has been gradually worsening. The cough is productive of sputum. Associated symptoms include chills, a fever, headaches, a sore throat (early am) and wheezing. The treatment provided no relief.  Headache  Associated symptoms include coughing, a fever and a sore throat (early am).    Secondary to cough has developed rib and abdominal muscle soreness Quit smoking 4 years ago  No history of allergies or asthma PMH/ Hypertension  Review of Systems  Constitutional: Positive for fever and chills.  HENT: Positive for sore throat (early am).   Respiratory: Positive for cough and wheezing.   Neurological: Positive for headaches.       Objective:   Physical Exam  Constitutional: He appears well-developed.  HENT:  Right Ear: External ear normal.  Left Ear: External ear normal.  Neck: Neck supple.  Cardiovascular: Normal rate, regular rhythm and normal heart sounds.   Pulmonary/Chest: He has wheezes. He has rhonchi.       After nebulized therapy improved air movement Rare rhonchi          Assessment & Plan:   1. Asthmatic bronchitis  albuterol (PROVENTIL) (2.5 MG/3ML) 0.083% nebulizer solution 2.5 mg, ipratropium (ATROVENT) nebulizer solution 0.5 mg, azithromycin (ZITHROMAX) 250 MG tablet, albuterol (PROVENTIL HFA;VENTOLIN HFA) 108 (90 BASE) MCG/ACT inhaler, HYDROcodone-homatropine (HYCODAN) 5-1.5 MG/5ML syrup   Anticipatory guidance RTC if symptoms persist or worsen

## 2012-06-30 ENCOUNTER — Other Ambulatory Visit: Payer: Self-pay | Admitting: Internal Medicine

## 2012-10-22 ENCOUNTER — Encounter: Payer: Self-pay | Admitting: Internal Medicine

## 2012-10-22 ENCOUNTER — Ambulatory Visit (INDEPENDENT_AMBULATORY_CARE_PROVIDER_SITE_OTHER): Payer: BC Managed Care – PPO | Admitting: Internal Medicine

## 2012-10-22 VITALS — BP 110/70 | HR 82 | Temp 98.3°F | Resp 18 | Ht 72.0 in | Wt 253.0 lb

## 2012-10-22 DIAGNOSIS — I4891 Unspecified atrial fibrillation: Secondary | ICD-10-CM | POA: Insufficient documentation

## 2012-10-22 DIAGNOSIS — I10 Essential (primary) hypertension: Secondary | ICD-10-CM | POA: Insufficient documentation

## 2012-10-22 DIAGNOSIS — I499 Cardiac arrhythmia, unspecified: Secondary | ICD-10-CM

## 2012-10-22 DIAGNOSIS — E291 Testicular hypofunction: Secondary | ICD-10-CM | POA: Insufficient documentation

## 2012-10-22 LAB — LIPID PANEL
Cholesterol: 188 mg/dL (ref 0–200)
LDL Cholesterol: 137 mg/dL — ABNORMAL HIGH (ref 0–99)
Total CHOL/HDL Ratio: 6.1 Ratio
VLDL: 20 mg/dL (ref 0–40)

## 2012-10-22 LAB — COMPREHENSIVE METABOLIC PANEL
ALT: 14 U/L (ref 0–53)
CO2: 28 mEq/L (ref 19–32)
Calcium: 9.2 mg/dL (ref 8.4–10.5)
Chloride: 104 mEq/L (ref 96–112)
Creat: 0.91 mg/dL (ref 0.50–1.35)
Glucose, Bld: 95 mg/dL (ref 70–99)
Total Bilirubin: 0.6 mg/dL (ref 0.3–1.2)
Total Protein: 6.5 g/dL (ref 6.0–8.3)

## 2012-10-22 LAB — T4, FREE: Free T4: 1.23 ng/dL (ref 0.80–1.80)

## 2012-10-22 LAB — POCT CBC
HCT, POC: 47.2 % (ref 43.5–53.7)
Hemoglobin: 15.7 g/dL (ref 14.1–18.1)
Lymph, poc: 3 (ref 0.6–3.4)
MCH, POC: 30 pg (ref 27–31.2)
MCHC: 33.3 g/dL (ref 31.8–35.4)
RBC: 5.23 M/uL (ref 4.69–6.13)
WBC: 8.2 10*3/uL (ref 4.6–10.2)

## 2012-10-22 LAB — TSH: TSH: 3.131 u[IU]/mL (ref 0.350–4.500)

## 2012-10-22 NOTE — Progress Notes (Signed)
  Subjective:    Patient ID: Michael Buck, male    DOB: 11-Nov-1950, 62 y.o.   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  Past lab values have included low testosterone which he decided not to treat and slight elevation in TSH within normal free T4 both in 2011 LDL at last check 18 months ago was 119 He has managed to lose 10 pounds over the last year   Review of Systems No fever chills or night sweats  No change in exercise tolerance  No cough or wheezing /quit smoking 2009 No reflux  No edema     Objective:   Physical Exam 110/70 w/out taking meds this am-he has run out NAD HEENT clear No thyromegaly Heart with an irregularly irregular rate around 100/no murmur Lungs clear Abdomen supple without organomegaly or masses/no carotid bruit/no abdominal bruit Extremities with full peripheral pulses and no edema Neurological intact       Assessment & Plan:  Problem #1 new onset atrial fibrillation Problem #2 hypertension  Problem #3 elevated BMI  Problem #4 history of low testosterone   CBC within normal limits/labs sent include metabolic profile TSH and free T4 He will see Dr. Jacinto Halim at 12:45 today

## 2012-10-22 NOTE — Progress Notes (Signed)
  Subjective:    Patient ID: Michael Buck, male    DOB: [birth year], 62 y.o.   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  Past lab values have included low testosterone which he decided not to treat and slight elevation in TSH within normal free T4 both in 2011 LDL at last check 18 months ago was 119 He has managed to lose 10 pounds over the last year   Review of Systems No fever chills or night sweats  No change in exercise tolerance  No cough or wheezing /quit smoking 2009 No reflux  No edema     Objective:   Physical Exam 110/70 w/out taking meds this am-he has run out NAD HEENT clear No thyromegaly Heart with an irregularly irregular rate around 100/no murmur Lungs clear Abdomen supple without organomegaly or masses/no carotid bruit/no abdominal bruit Extremities with full peripheral pulses and no edema Neurological intact       Assessment & Plan:  Problem #1 new onset atrial fibrillation Problem #2 hypertension  Problem #3 elevated BMI  Problem #4 history of low testosterone   CBC within normal limits/labs sent include metabolic profile TSH and free T4 He will see Dr. Jacinto Halim at 12:45 today  After explaining the nature of atrial fibrillation and he admits that over the past 4 weeks he has had episodes where he gets short of breath after bending over to pick things up at work that resolved over several minutes with rest but still denies palpitations/ chest pain

## 2012-10-28 ENCOUNTER — Encounter: Payer: Self-pay | Admitting: Internal Medicine

## 2012-11-27 ENCOUNTER — Telehealth: Payer: Self-pay

## 2012-11-27 NOTE — Telephone Encounter (Signed)
Patient has concern regarding his DOT card. Seen cardiologist, needs his card. [phone]

## 2012-11-27 NOTE — Telephone Encounter (Signed)
He is going to have his heart stopped and then restarted.  He also has sleep apnea. He states cardiology has told him he is okay. I have advised him to return to clinic with the statement from cardiology he is cleared then we can finish his DOT papers.

## 2012-11-27 NOTE — Telephone Encounter (Signed)
Needs follow up in office to finish the paper work.

## 2012-11-28 ENCOUNTER — Telehealth: Payer: Self-pay

## 2012-11-28 NOTE — Telephone Encounter (Signed)
Pt had his CPE and personal DOT with Dr. Merla Riches. Dr. Merla Riches sent him to Dr. Jacinto Halim for cardiac issues. Pt states that Dr. Jacinto Halim has faxed clearance to [place] and pt would like status of his DOT card from Dr. Merla Riches. Please note I informed pt that Dr. Merla Riches is off this week.  Pt [phone]

## 2012-11-28 NOTE — Telephone Encounter (Signed)
Patient scheduled for Cardioversion. I have advised patient he must return to office for DOT clearance. Called him again and he is provided Dr Merla Riches hours. He states he will come in on Sunday.

## 2012-11-28 NOTE — Telephone Encounter (Signed)
Correct (There is a note from cardiology clearing him)

## 2012-12-03 ENCOUNTER — Encounter (HOSPITAL_COMMUNITY)
Admission: RE | Disposition: A | Discharge status: Home / Self Care | Payer: Self-pay | Source: Ambulatory Visit | Attending: Cardiology

## 2012-12-03 ENCOUNTER — Encounter (HOSPITAL_COMMUNITY): Payer: Self-pay | Admitting: Anesthesiology

## 2012-12-03 ENCOUNTER — Encounter (HOSPITAL_COMMUNITY): Payer: Self-pay | Admitting: *Deleted

## 2012-12-03 ENCOUNTER — Ambulatory Visit (HOSPITAL_COMMUNITY): Payer: BC Managed Care – PPO | Admitting: Anesthesiology

## 2012-12-03 ENCOUNTER — Ambulatory Visit (HOSPITAL_COMMUNITY)
Admission: RE | Admit: 2012-12-03 | Discharge: 2012-12-03 | Disposition: A | Discharge status: Home / Self Care | Payer: BC Managed Care – PPO | Source: Ambulatory Visit | Attending: Cardiology | Admitting: Cardiology

## 2012-12-03 DIAGNOSIS — I10 Essential (primary) hypertension: Secondary | ICD-10-CM | POA: Insufficient documentation

## 2012-12-03 DIAGNOSIS — I4891 Unspecified atrial fibrillation: Secondary | ICD-10-CM | POA: Insufficient documentation

## 2012-12-03 HISTORY — PX: CARDIOVERSION: SHX1299

## 2012-12-03 LAB — BASIC METABOLIC PANEL
BUN: 16 mg/dL (ref 6–23)
Calcium: 8.9 mg/dL (ref 8.4–10.5)
Chloride: 106 mEq/L (ref 96–112)
Glucose, Bld: 97 mg/dL (ref 70–99)
Potassium: 4.1 mEq/L (ref 3.5–5.1)

## 2012-12-03 SURGERY — CARDIOVERSION
Anesthesia: Monitor Anesthesia Care

## 2012-12-03 MED ORDER — SODIUM CHLORIDE 0.9 % IV SOLN
INTRAVENOUS | Status: DC
  Administered 2012-12-03: 500 mL via INTRAVENOUS

## 2012-12-03 MED ORDER — PROPOFOL 10 MG/ML IV BOLUS
INTRAVENOUS | Status: DC | PRN
  Administered 2012-12-03: 80 mg via INTRAVENOUS

## 2012-12-03 MED ORDER — LIDOCAINE HCL (CARDIAC) 20 MG/ML IV SOLN
INTRAVENOUS | Status: DC | PRN
  Administered 2012-12-03: 30 mg via INTRAVENOUS

## 2012-12-03 MED ORDER — SODIUM CHLORIDE 0.9 % IV SOLN
INTRAVENOUS | Status: DC | PRN
  Administered 2012-12-03: 11:00:00 via INTRAVENOUS

## 2012-12-03 NOTE — H&P (Signed)
  Please see office visit notes for complete details of HPI.  

## 2012-12-03 NOTE — Anesthesia Postprocedure Evaluation (Signed)
  Anesthesia Post-op Note  Patient: Michael Buck  Procedure(s) Performed: Procedure(s): CARDIOVERSION (N/A)  Patient Location: Short Stay  Anesthesia Type:MAC  Level of Consciousness: awake  Airway and Oxygen Therapy: Patient Spontanous Breathing  Post-op Pain: mild  Post-op Assessment: Post-op Vital signs reviewed  Post-op Vital Signs: Reviewed  Complications: No apparent anesthesia complications

## 2012-12-03 NOTE — Anesthesia Preprocedure Evaluation (Signed)
Anesthesia Evaluation  Patient identified by MRN, date of birth, ID band Patient awake    Reviewed: Allergy & Precautions, H&P   Airway Mallampati: II TM Distance: >3 FB Neck ROM: Full    Dental  (+) Teeth Intact and Dental Advisory Given   Pulmonary          Cardiovascular hypertension, Pt. on medications + dysrhythmias Atrial Fibrillation     Neuro/Psych    GI/Hepatic   Endo/Other    Renal/GU      Musculoskeletal   Abdominal   Peds  Hematology   Anesthesia Other Findings   Reproductive/Obstetrics                           Anesthesia Physical Anesthesia Plan  ASA: II  Anesthesia Plan: General   Post-op Pain Management:    Induction: Intravenous  Airway Management Planned: Mask  Additional Equipment:   Intra-op Plan:   Post-operative Plan:   Informed Consent:   Dental advisory given  Plan Discussed with: CRNA and Anesthesiologist  Anesthesia Plan Comments:         Anesthesia Quick Evaluation

## 2012-12-03 NOTE — CV Procedure (Signed)
Direct current cardioversion:  Indication symptomatic A. Fibrillation.  Procedure: Using 90 mg of IV Propofol for achieving deep (Moderate sedation), synchronized direct current cardioversion performed. Patient was delivered with 120 Joules of electricity X 1 with success to NSR. Patient tolerated the procedure well. No immediate complication noted.

## 2012-12-03 NOTE — Anesthesia Postprocedure Evaluation (Signed)
  Anesthesia Post-op Note  Patient: Michael Buck  Procedure(s) Performed: Procedure(s): CARDIOVERSION (N/A)  Patient Location: Endoscopy Unit  Anesthesia Type:General  Level of Consciousness: awake, alert  and oriented  Airway and Oxygen Therapy: Patient Spontanous Breathing and Patient connected to nasal cannula oxygen  Post-op Pain: none   Post-op Assessment: Post-op Vital signs reviewed, Patient's Cardiovascular Status Stable, Respiratory Function Stable, Patent Airway, No signs of Nausea or vomiting, Adequate PO intake and Pain level controlled  Post-op Vital Signs: Reviewed and stable  Complications: No apparent anesthesia complications

## 2012-12-03 NOTE — Transfer of Care (Signed)
Immediate Anesthesia Transfer of Care Note  Patient: Michael Buck  Procedure(s) Performed: Procedure(s): CARDIOVERSION (N/A)  Patient Location: Endoscopy Unit  Anesthesia Type:General  Level of Consciousness: awake, alert  and oriented  Airway & Oxygen Therapy: Patient Spontanous Breathing and Patient connected to nasal cannula oxygen  Post-op Assessment: Report given to PACU RN, Post -op Vital signs reviewed and stable and Patient moving all extremities  Post vital signs: Reviewed and stable  Complications: No apparent anesthesia complications

## 2012-12-03 NOTE — Interval H&P Note (Signed)
History and Physical Interval Note:  12/03/2012 11:19 AM  Michael Buck  has presented today for surgery, with the diagnosis of a fib  The various methods of treatment have been discussed with the patient and family. After consideration of risks, benefits and other options for treatment, the patient has consented to  Procedure(s): CARDIOVERSION (N/A) as a surgical intervention .  The patient's history has been reviewed, patient examined, no change in status, stable for surgery.  I have reviewed the patient's chart and labs.  Questions were answered to the patient's satisfaction.     Pamella Pert

## 2012-12-04 ENCOUNTER — Encounter (HOSPITAL_COMMUNITY): Payer: Self-pay | Admitting: Cardiology

## 2012-12-13 ENCOUNTER — Ambulatory Visit (HOSPITAL_BASED_OUTPATIENT_CLINIC_OR_DEPARTMENT_OTHER): Payer: BC Managed Care – PPO | Attending: Cardiology | Admitting: Radiology

## 2012-12-13 VITALS — Ht 73.0 in | Wt 255.0 lb

## 2012-12-13 DIAGNOSIS — G4737 Central sleep apnea in conditions classified elsewhere: Secondary | ICD-10-CM | POA: Insufficient documentation

## 2012-12-13 DIAGNOSIS — G4733 Obstructive sleep apnea (adult) (pediatric): Secondary | ICD-10-CM | POA: Insufficient documentation

## 2012-12-21 DIAGNOSIS — R0609 Other forms of dyspnea: Secondary | ICD-10-CM

## 2012-12-21 DIAGNOSIS — R0989 Other specified symptoms and signs involving the circulatory and respiratory systems: Secondary | ICD-10-CM

## 2012-12-21 DIAGNOSIS — G4733 Obstructive sleep apnea (adult) (pediatric): Secondary | ICD-10-CM

## 2012-12-21 DIAGNOSIS — G4737 Central sleep apnea in conditions classified elsewhere: Secondary | ICD-10-CM

## 2012-12-22 NOTE — Procedures (Signed)
NAME:  NATHAN, MOCTEZUMA NO.:  [id number]  MEDICAL RECORD NO.:  [id number]          PATIENT TYPE:  OUT  LOCATION:  SLEEP CENTER                 FACILITY:  [facility]  PHYSICIAN:  Clinton D. Maple Hudson, MD, FCCP, FACPDATE OF BIRTH:  29-Jun-1951  DATE OF STUDY:  12/13/2012                           NOCTURNAL POLYSOMNOGRAM  REFERRING PHYSICIAN:  Pamella Pert, MD  INDICATION FOR STUDY:  Hypersomnia with sleep apnea.  EPWORTH SLEEPINESS SCORE:  13/24.  BMI 33.6, weight 255 pounds.  Height 73 inches, neck 18.5 inches.  MEDICATIONS:  Home medications charted and reviewed.  SLEEP ARCHITECTURE:  Split study protocol.  During the diagnostic phase, total sleep time 120 minutes with sleep efficiency 83.9%.  Stage I was 6.3%, stage II 93.8%, stage III and REM were absent.  Sleep latency 11 minutes.  Awake after sleep onset 12.5 minutes.  Arousal index 67.5. Bedtime medication:  None.  RESPIRATORY DATA:  Split study protocol.  Apnea/hypopnea index (AHI) 93 per hour.  A total of 186 events was scored including 21 obstructive apneas, 25 central apneas, 30 mixed apneas, 110 hypopneas.  Events were associated with supine sleep position.  CPAP was titrated to 16 CWP, AHI 3.9 per hour.  He wore a large ResMed Quattro FX full-face mask with heated humidifier.  OXYGEN DATA:  Snoring before CPAP with oxygen desaturation to a nadir of 85% on room air.  With CPAP control, snoring was prevented and mean oxygen saturation held 95.9% on room air.  CARDIAC DATA:  Sinus rhythm with PACs and PVCs.  MOVEMENT-PARASOMNIA:  Incidental limb jerks were noted during CPAP titration with little effect on sleep.  Bathroom x1.  IMPRESSIONS-RECOMMENDATIONS: 1. 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  Moderate snoring with oxygen     desaturation to a nadir of 85% on room air. 2. Successful CPAP titration to 16 CWP, AHI 3.9 per hour.  He wore  a     large ResMed Quattro FX full-face mask with heated humidifier.     Snoring was prevented and mean oxygen saturation of 95.9% on room     air.     Clinton D. Maple Hudson, MD, [facility], FACP Diplomate, American Board of Sleep Medicine    CDY/MEDQ  D:  12/21/2012 11:05:08  T:  12/22/2012 00:04:01  Job:  [id number]

## 2013-01-14 ENCOUNTER — Encounter: Payer: Self-pay | Admitting: Internal Medicine

## 2013-02-18 ENCOUNTER — Institutional Professional Consult (permissible substitution): Payer: BC Managed Care – PPO | Admitting: Internal Medicine

## 2013-02-21 ENCOUNTER — Encounter: Payer: Self-pay | Admitting: Internal Medicine

## 2013-02-21 ENCOUNTER — Ambulatory Visit (INDEPENDENT_AMBULATORY_CARE_PROVIDER_SITE_OTHER): Payer: BC Managed Care – PPO | Admitting: Internal Medicine

## 2013-02-21 VITALS — BP 126/76 | HR 63 | Ht 72.0 in | Wt 245.8 lb

## 2013-02-21 DIAGNOSIS — G4733 Obstructive sleep apnea (adult) (pediatric): Secondary | ICD-10-CM

## 2013-02-21 NOTE — Patient Instructions (Addendum)
Order- DME- new CPAP autotitrate 10-20 x 7 days for pressure recommendation, mask of choice, humidifier, supplies    Dx OSA  Please call as needed

## 2013-02-21 NOTE — Progress Notes (Signed)
02/21/13- 85 yoM former smoker referred courtesy of Dr Jacinto Halim for sleep medicine evaluation. He needed a DOT physical and was required to have a sleep study following that. He admits easy sleep onset if he sits quietly but insists that he has no problem staying alert while driving. History of snoring. 3 cups of coffee per day. No ENT surgery and no history of nasal fracture. Sleep onset between 9 and 10 PM, sleep latency 5 minutes, nocturia x3 before up at 4 AM. Medical history of atrial fibrillation/ eliquis, hypertension. He has a rescue inhaler which he rarely uses, but he says he's never been diagnosed with lung disease. 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 Family history that both parents died with myocardial infarction. Father snored loudly. NPSG 12/13/12- severe obstructive sleep apnea/hypoxia syndrome, AHI 93 per hour with body weight 255 pounds. CPAP titrated to 16 for AHI 3.9 per hour. Well tolerated.  Prior to Admission medications   Medication Sig Start Date End Date Taking? Authorizing Provider  apixaban (ELIQUIS) 5 MG TABS tablet Take 5 mg by mouth 2 (two) times daily.   Yes Historical Provider, MD  diltiazem (CARDIZEM CD) 180 MG 24 hr capsule Take 180 mg by mouth daily.   Yes Historical Provider, MD  flecainide (TAMBOCOR) 100 MG tablet Take 1 tablet by mouth 2 (two) times daily. 02/13/13  Yes Historical Provider, MD  metoprolol (LOPRESSOR) 50 MG tablet Take 1 tablet by mouth 2 (two) times daily. 02/13/13  Yes Historical Provider, MD   Past Medical History  Diagnosis Date  . Hypertension   . Obesity    Past Surgical History  Procedure Laterality Date  . Appendectomy    . Cardioversion N/A 12/03/2012    Procedure: CARDIOVERSION;  Surgeon: Pamella Pert, MD;  Location: [facility] ENDOSCOPY;  Service: Cardiovascular;  Laterality: N/A;   Family History  Problem Relation Age of Onset  . Stroke Mother   . Hypertension Father   . Diabetes Father   .  Heart attack Father   . Colon cancer Neg Hx   . Esophageal cancer Neg Hx   . Stomach cancer Neg Hx   . Rectal cancer Neg Hx    History   Social History  . Marital Status: Married    Spouse Name: N/A    Number of Children: 1  . Years of Education: N/A   Occupational History  . Electrician     Engineer, drilling    Social History Main Topics  . Smoking status: Former Games developer  . Smokeless tobacco: Never Used  . Alcohol Use: No     Comment: occ beer   . Drug Use: No  . Sexually Active: Not on file   Other Topics Concern  . Not on file   Social History Narrative   Daily caffeine    ROS-see HPI Constitutional:   No-   weight loss, night sweats, fevers, chills, fatigue, lassitude. HEENT:   No-  headaches, difficulty swallowing, tooth/dental problems, sore throat,       No-  sneezing, itching, ear ache, nasal congestion, post nasal drip,  CV:  No-   chest pain, orthopnea, PND, swelling in lower extremities, anasarca,                                  dizziness, palpitations Resp: No-   shortness of breath with exertion or at rest.  No-   productive cough,  No non-productive cough,  No- coughing up of blood.              No-   change in color of mucus.  No- wheezing.   Skin: No-   rash or lesions. GI:  No-   heartburn, indigestion, abdominal pain, nausea, vomiting, diarrhea,                 change in bowel habits, loss of appetite GU: No-   dysuria, change in color of urine, no urgency or frequency.  No- flank pain. MS:  No-   joint pain or swelling.  No- decreased range of motion.  No- back pain. Neuro-     nothing unusual Psych:  No- change in mood or affect. No depression or anxiety.  No memory loss.  OBJ- Physical Exam General- Alert, Oriented, Affect-appropriate, Distress- none acute. Mild overweight Skin- rash-none, lesions- none, excoriation- none Lymphadenopathy- none Head- atraumatic            Eyes- Gross vision intact, PERRLA, conjunctivae and secretions  clear            Ears- Hearing, canals-normal            Nose- Clear, no-Septal dev, mucus, polyps, erosion, perforation             Throat- Mallampati III , mucosa clear , drainage- none, tonsils- atrophic. Dentures Neck- flexible , trachea midline, no stridor , thyroid nl, carotid no bruit Chest - symmetrical excursion , unlabored           Heart/CV- +IRR , no murmur , no gallop  , no rub, nl s1 s2                           - JVD- none , edema- none, stasis changes- none, varices- none           Lung- clear to P&A, wheeze- none, cough- none , dullness-none, rub- none           Chest wall-  Abd- tender-no, distended-no, bowel sounds-present, HSM- no Br/ Gen/ Rectal- Not done, not indicated Extrem- cyanosis- none, clubbing, none, atrophy- none, strength- nl Neuro- grossly intact to observation

## 2013-03-05 DIAGNOSIS — G4733 Obstructive sleep apnea (adult) (pediatric): Secondary | ICD-10-CM | POA: Insufficient documentation

## 2013-03-05 NOTE — Assessment & Plan Note (Signed)
NPSG 12/13/12- severe obstructive sleep apnea/hypoxia syndrome, AHI 93 per hour with body weight 255 pounds. CPAP titrated to 16 for AHI 3.9 per hour. Well tolerated We discussed the medical issues and available treatments related to obstructive sleep apnea. I explained the importance of weight control and his responsibility to drive safely. He is not a candidate for an oral appliance because of his dentures. He is a good candidate for CPAP and demonstrated good response. Plan-begin CPAP, autotitrate for pressure recommendation.I don't expect a driving restriction as long as he is compliant with treatment for his sleep apnea.

## 2013-03-11 ENCOUNTER — Encounter: Payer: Self-pay | Admitting: Internal Medicine

## 2013-04-08 ENCOUNTER — Encounter (HOSPITAL_COMMUNITY): Payer: Self-pay | Admitting: Anesthesiology

## 2013-04-08 ENCOUNTER — Encounter (HOSPITAL_COMMUNITY): Payer: Self-pay

## 2013-04-08 ENCOUNTER — Ambulatory Visit (HOSPITAL_COMMUNITY): Payer: BC Managed Care – PPO | Admitting: Anesthesiology

## 2013-04-08 ENCOUNTER — Encounter (HOSPITAL_COMMUNITY)
Admission: RE | Disposition: A | Discharge status: Home / Self Care | Payer: Self-pay | Source: Ambulatory Visit | Attending: Cardiology

## 2013-04-08 ENCOUNTER — Ambulatory Visit (HOSPITAL_COMMUNITY)
Admission: RE | Admit: 2013-04-08 | Discharge: 2013-04-08 | Disposition: A | Discharge status: Home / Self Care | Payer: BC Managed Care – PPO | Source: Ambulatory Visit | Attending: Cardiology | Admitting: Cardiology

## 2013-04-08 DIAGNOSIS — G4737 Central sleep apnea in conditions classified elsewhere: Secondary | ICD-10-CM | POA: Insufficient documentation

## 2013-04-08 DIAGNOSIS — G4733 Obstructive sleep apnea (adult) (pediatric): Secondary | ICD-10-CM | POA: Insufficient documentation

## 2013-04-08 DIAGNOSIS — I10 Essential (primary) hypertension: Secondary | ICD-10-CM | POA: Insufficient documentation

## 2013-04-08 DIAGNOSIS — E669 Obesity, unspecified: Secondary | ICD-10-CM | POA: Insufficient documentation

## 2013-04-08 DIAGNOSIS — Z79899 Other long term (current) drug therapy: Secondary | ICD-10-CM | POA: Insufficient documentation

## 2013-04-08 DIAGNOSIS — Z7901 Long term (current) use of anticoagulants: Secondary | ICD-10-CM | POA: Insufficient documentation

## 2013-04-08 DIAGNOSIS — I4891 Unspecified atrial fibrillation: Secondary | ICD-10-CM | POA: Insufficient documentation

## 2013-04-08 HISTORY — PX: CARDIOVERSION: SHX1299

## 2013-04-08 SURGERY — CARDIOVERSION
Anesthesia: General

## 2013-04-08 MED ORDER — LIDOCAINE HCL (CARDIAC) 20 MG/ML IV SOLN
INTRAVENOUS | Status: DC | PRN
  Administered 2013-04-08: 40 mg via INTRAVENOUS

## 2013-04-08 MED ORDER — SODIUM CHLORIDE 0.9 % IV SOLN
INTRAVENOUS | Status: DC
  Administered 2013-04-08 (×2): via INTRAVENOUS

## 2013-04-08 MED ORDER — PROPOFOL 10 MG/ML IV BOLUS
INTRAVENOUS | Status: DC | PRN
  Administered 2013-04-08: 120 mg via INTRAVENOUS

## 2013-04-08 NOTE — CV Procedure (Signed)
Direct current cardioversion:  Indication symptomatic A. Fibrillation.  Procedure: Using 120 mg of IV Propofol and 40 mg IV lidocaine for achieving deep (Moderate sedation), synchronized direct current cardioversion performed. Patient was delivered with 120 Joules of electricity X 1 with success to NSR. Patient tolerated the procedure well. No immediate complication noted.

## 2013-04-08 NOTE — Anesthesia Preprocedure Evaluation (Addendum)
Anesthesia Evaluation  Patient identified by MRN, date of birth, ID band Patient awake    Reviewed: Allergy & Precautions, H&P , NPO status , Patient's Chart, lab work & pertinent test results, reviewed documented beta blocker date and time   Airway Mallampati: II TM Distance: >3 FB Neck ROM: Full    Dental  (+) Teeth Intact and Dental Advisory Given   Pulmonary sleep apnea and Continuous Positive Airway Pressure Ventilation ,          Cardiovascular hypertension, Pt. on medications + dysrhythmias Atrial Fibrillation Rhythm:Irregular Rate:Normal  Echo 10/2012 Slight LA dilation, LVSF WNL (EF 62%), mild MR   Neuro/Psych negative neurological ROS  negative psych ROS   GI/Hepatic negative GI ROS, Neg liver ROS,   Endo/Other  negative endocrine ROS  Renal/GU negative Renal ROS     Musculoskeletal negative musculoskeletal ROS (+)   Abdominal (+) + obese,  Abdomen: soft. Bowel sounds: normal.  Peds  Hematology negative hematology ROS (+)   Anesthesia Other Findings   Reproductive/Obstetrics                       Anesthesia Physical  Anesthesia Plan  ASA: II  Anesthesia Plan: General   Post-op Pain Management:    Induction: Intravenous  Airway Management Planned: Mask  Additional Equipment:   Intra-op Plan:   Post-operative Plan:   Informed Consent:   Dental advisory given  Plan Discussed with: CRNA  Anesthesia Plan Comments:        Anesthesia Quick Evaluation

## 2013-04-08 NOTE — Interval H&P Note (Signed)
History and Physical Interval Note:  04/08/2013 12:24 PM  Michael Buck  has presented today for surgery, with the diagnosis of AFIB  The various methods of treatment have been discussed with the patient and family. After consideration of risks, benefits and other options for treatment, the patient has consented to  Procedure(s): CARDIOVERSION (N/A) as a surgical intervention .  The patient's history has been reviewed, patient examined, no change in status, stable for surgery.  I have reviewed the patient's chart and labs.  Questions were answered to the patient's satisfaction.     Pamella Pert

## 2013-04-08 NOTE — Anesthesia Postprocedure Evaluation (Signed)
Anesthesia Post Note  Patient: Michael Buck  Procedure(s) Performed: Procedure(s) (LRB): CARDIOVERSION (N/A)  Anesthesia type: General  Patient location: PACU  Post pain: Pain level controlled  Post assessment: Post-op Vital signs reviewed  Last Vitals: BP 124/79  Pulse 45  Temp(Src) 36.9 C (Oral)  Resp 18  SpO2 100%  Post vital signs: Reviewed  Level of consciousness: sedated  Complications: No apparent anesthesia complications

## 2013-04-08 NOTE — Anesthesia Procedure Notes (Signed)
Procedure Name: MAC Date/Time: 04/08/2013 12:30 PM Performed by: Ellin Goodie Pre-anesthesia Checklist: Patient identified, Emergency Drugs available, Suction available, Patient being monitored and Timeout performed Patient Re-evaluated:Patient Re-evaluated prior to inductionOxygen Delivery Method: Ambu bag Preoxygenation: Pre-oxygenation with 100% oxygen Intubation Type: IV induction Ventilation: Mask ventilation without difficulty Placement Confirmation: breath sounds checked- equal and bilateral Dental Injury: Teeth and Oropharynx as per pre-operative assessment

## 2013-04-08 NOTE — Preoperative (Signed)
Beta Blockers   Reason not to administer Beta Blockers:Not Applicable 

## 2013-04-08 NOTE — Transfer of Care (Signed)
Immediate Anesthesia Transfer of Care Note  Patient: Michael Buck  Procedure(s) Performed: Procedure(s): CARDIOVERSION (N/A)  Patient Location: PACU  Anesthesia Type:MAC  Level of Consciousness: awake, alert  and oriented  Airway & Oxygen Therapy: Patient Spontanous Breathing  Post-op Assessment: Report given to PACU RN  Post vital signs: stable  Complications: No apparent anesthesia complications

## 2013-04-08 NOTE — H&P (Signed)
  Please see office visit notes for complete details of HPI.  

## 2013-04-09 ENCOUNTER — Encounter (HOSPITAL_COMMUNITY): Payer: Self-pay | Admitting: Cardiology

## 2013-04-10 ENCOUNTER — Encounter: Payer: Self-pay | Admitting: Internal Medicine

## 2013-04-10 ENCOUNTER — Ambulatory Visit (INDEPENDENT_AMBULATORY_CARE_PROVIDER_SITE_OTHER): Payer: BC Managed Care – PPO | Admitting: Internal Medicine

## 2013-04-10 ENCOUNTER — Telehealth: Payer: Self-pay | Admitting: Internal Medicine

## 2013-04-10 VITALS — BP 118/82 | HR 50 | Ht 73.0 in | Wt 253.2 lb

## 2013-04-10 DIAGNOSIS — I4891 Unspecified atrial fibrillation: Secondary | ICD-10-CM

## 2013-04-10 DIAGNOSIS — G4733 Obstructive sleep apnea (adult) (pediatric): Secondary | ICD-10-CM

## 2013-04-10 MED ORDER — ZALEPLON 10 MG PO CAPS: ORAL_CAPSULE | ORAL | Status: DC

## 2013-04-10 NOTE — Telephone Encounter (Signed)
ERROR - WRONG PT - NEW MSG COMONG. Hazel Sams

## 2013-04-10 NOTE — Assessment & Plan Note (Signed)
NPSG 12/13/12- severe obstructive sleep apnea/hypoxia syndrome, AHI 93 per hour with body weight 255 pounds. CPAP titrated to 16 for AHI 3.9 per hour. Well tolerated He is getting used to CPAP. We discussed short term use of a short half-life sleep med as needed to reduce the toss and turn. I see no reason to restrict driving now.

## 2013-04-10 NOTE — Patient Instructions (Addendum)
We can continue CPAP/ APS.     Script for sleep medicine Sonata that you can take if needed. This can be taken late, after you have been asleep, as long as you plan to sleep at least 3-4 more hours.  I see no reason to restrict your driving now.

## 2013-04-10 NOTE — Assessment & Plan Note (Signed)
Sinus rhythm at this exam, after recent cardioversion.

## 2013-04-10 NOTE — Progress Notes (Signed)
02/21/13- 44 yoM former smoker referred courtesy of Dr Jacinto Halim for sleep medicine evaluation. He needed a DOT physical and was required to have a sleep study following that. He admits easy sleep onset if he sits quietly but insists that he has no problem staying alert while driving. History of snoring. 3 cups of coffee per day. No ENT surgery and no history of nasal fracture. Sleep onset between 9 and 10 PM, sleep latency 5 minutes, nocturia x3 before up at 4 AM. Medical history of atrial fibrillation/ eliquis, hypertension. He has a rescue inhaler which he rarely uses, but he says he's never been diagnosed with lung disease. 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 Family history that both parents died with myocardial infarction. Father snored loudly. NPSG 12/13/12- severe obstructive sleep apnea/hypoxia syndrome, AHI 93 per hour with body weight 255 pounds. CPAP titrated to 16 for AHI 3.9 per hour. Well tolerated.  04/10/13- 9 yoM former smoker referred courtesy of Dr Jacinto Halim for sleep medicine evaluation. He needed a DOT physical and was required to have a sleep study following that FOLLOWS FOR: Pt reports struggling with CPAP machine. Wears CPAP auto/ APS nightly x 5-6hrs. Pt denies problems with pressure. Pt has new mask full face mask rather than nasal mask.  Had cardioversion/ Dr Jacinto Halim. Toss and turn pulls CPAP hose. Sleeps better with it and wife confirms he doesn't snore.   ROS-see HPI Constitutional:   No-   weight loss, night sweats, fevers, chills, fatigue, lassitude. HEENT:   No-  headaches, difficulty swallowing, tooth/dental problems, sore throat,       No-  sneezing, itching, ear ache, nasal congestion, post nasal drip,  CV:  No-   chest pain, orthopnea, PND, swelling in lower extremities, anasarca,  dizziness, palpitations Resp: No-   shortness of breath with exertion or at rest.              No-   productive cough,  No non-productive cough,  No-  coughing up of blood.              No-   change in color of mucus.  No- wheezing.   Skin: No-   rash or lesions. GI:  No-   heartburn, indigestion, abdominal pain, nausea, vomiting,  GU:  MS:  No-   joint pain or swelling.   Neuro-     nothing unusual Psych:  No- change in mood or affect. No depression or anxiety.  No memory loss.  OBJ- Physical Exam General- Alert, Oriented, Affect-appropriate, Distress- none acute. Mild overweight Skin- rash-none, lesions- none, excoriation- none Lymphadenopathy- none Head- atraumatic            Eyes- Gross vision intact, PERRLA, conjunctivae and secretions clear            Ears- Hearing, canals-normal            Nose- Clear, no-Septal dev, mucus, polyps, erosion, perforation             Throat- Mallampati III , mucosa clear , drainage- none, tonsils- atrophic. Dentures Neck- flexible , trachea midline, no stridor , thyroid nl, carotid no bruit Chest - symmetrical excursion , unlabored           Heart/CV- Regular rate and rhythm, no murmur , no gallop  , no rub, nl s1 s2                           -  JVD- none , edema- none, stasis changes- none, varices- none           Lung- clear to P&A, wheeze- none, cough- none , dullness-none, rub- none           Chest wall-  Abd-  Br/ Gen/ Rectal- Not done, not indicated Extrem- cyanosis- none, clubbing, none, atrophy- none, strength- nl Neuro- grossly intact to observation     

## 2013-08-14 ENCOUNTER — Ambulatory Visit (INDEPENDENT_AMBULATORY_CARE_PROVIDER_SITE_OTHER): Payer: BC Managed Care – PPO | Admitting: Internal Medicine

## 2013-08-14 ENCOUNTER — Encounter: Payer: Self-pay | Admitting: Internal Medicine

## 2013-08-14 VITALS — BP 140/70 | HR 71 | Ht 73.0 in | Wt 268.6 lb

## 2013-08-14 DIAGNOSIS — G4733 Obstructive sleep apnea (adult) (pediatric): Secondary | ICD-10-CM

## 2013-08-14 NOTE — Patient Instructions (Signed)
Order- DME APS download CPAP for pressure and compliance check    Dx OSA  You can ask APS how to adjust the humidifier for comfort

## 2013-08-14 NOTE — Progress Notes (Signed)
02/21/13- 82 yoM former smoker referred courtesy of Dr Jacinto Halim for sleep medicine evaluation. He needed a DOT physical and was required to have a sleep study following that. He admits easy sleep onset if he sits quietly but insists that he has no problem staying alert while driving. History of snoring. 3 cups of coffee per day. No ENT surgery and no history of nasal fracture. Sleep onset between 9 and 10 PM, sleep latency 5 minutes, nocturia x3 before up at 4 AM. Medical history of atrial fibrillation/ eliquis, hypertension. He has a rescue inhaler which he rarely uses, but he says he's never been diagnosed with lung disease. 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 Family history that both parents died with myocardial infarction. Father snored loudly. NPSG 12/13/12- severe obstructive sleep apnea/hypoxia syndrome, AHI 93 per hour with body weight 255 pounds. CPAP titrated to 16 for AHI 3.9 per hour. Well tolerated.  04/10/13- 32 yoM former smoker referred courtesy of Dr Jacinto Halim for sleep medicine evaluation. He needed a DOT physical and was required to have a sleep study following that FOLLOWS FOR: Pt reports struggling with CPAP machine. Wears CPAP auto/ APS nightly x 5-6hrs. Pt denies problems with pressure. Pt has new mask full face mask rather than nasal mask.  Had cardioversion/ Dr Jacinto Halim. Toss and turn pulls CPAP hose. Sleeps better with it and wife confirms he doesn't snore.   08/14/13- 58 yoM former smoker referred courtesy of Dr Jacinto Halim for sleep medicine evaluation. FOLLOWS FOR:  Wears CPAP auto/ APS  every night for about 3-6 hours; DME is  APS Comfortable with CPAP pressure of 5-20, full face mask. He does follow some air and get "gas" but it does stop his snoring.  ROS-see HPI Constitutional:   No-   weight loss, night sweats, fevers, chills, fatigue, lassitude. HEENT:   No-  headaches, difficulty swallowing, tooth/dental problems, sore throat,       No-   sneezing, itching, ear ache, nasal congestion, post nasal drip,  CV:  No-   chest pain, orthopnea, PND, swelling in lower extremities, anasarca,  dizziness, palpitations Resp: No-   shortness of breath with exertion or at rest.              No-   productive cough,  No non-productive cough,  No- coughing up of blood.              No-   change in color of mucus.  No- wheezing.   Skin: No-   rash or lesions. GI:  No-   heartburn, indigestion, abdominal pain, nausea, vomiting,  GU:  MS:  No-   joint pain or swelling.   Neuro-     nothing unusual Psych:  No- change in mood or affect. No depression or anxiety.  No memory loss.  OBJ- Physical Exam General- Alert, Oriented, Affect-appropriate, Distress- none acute. Mild overweight Skin- rash-none, lesions- none, excoriation- none Lymphadenopathy- none Head- atraumatic            Eyes- Gross vision intact, PERRLA, conjunctivae and secretions clear            Ears- Hearing, canals-normal            Nose- Clear, no-Septal dev, mucus, polyps, erosion, perforation             Throat- Mallampati III , mucosa clear , drainage- none, tonsils- atrophic. Dentures Neck- flexible , trachea midline, no stridor , thyroid nl, carotid no  bruit Chest - symmetrical excursion , unlabored           Heart/CV- Regular rate and rhythm, no murmur , no gallop  , no rub, nl s1 s2                           - JVD- none , edema- none, stasis changes- none, varices- none           Lung- clear to P&A, wheeze- none, cough- none , dullness-none, rub- none           Chest wall-  Abd-  Br/ Gen/ Rectal- Not done, not indicated Extrem- cyanosis- none, clubbing, none, atrophy- none, strength- nl Neuro- grossly intact to observation

## 2013-09-02 NOTE — Assessment & Plan Note (Signed)
We may be able to reduce pressure and associated air swallowing.   Plan-download for fixed pressure recommendation

## 2014-09-17 ENCOUNTER — Encounter: Payer: Self-pay | Admitting: Gastroenterology

## 2015-08-09 ENCOUNTER — Encounter: Payer: Self-pay | Admitting: Internal Medicine

## 2016-01-21 ENCOUNTER — Telehealth: Payer: Self-pay | Admitting: Internal Medicine

## 2016-01-21 NOTE — Telephone Encounter (Signed)
lmtcb

## 2016-01-25 NOTE — Telephone Encounter (Signed)
lmtcb X2 for pt with pt's spouse. Pt has not been seen in clinic since 2014.

## 2016-01-26 NOTE — Telephone Encounter (Signed)
atc pt, line rang several times, no answer, no vm.  Wcb.  

## 2016-01-27 NOTE — Telephone Encounter (Signed)
Pt needs OV with CY before order can be sent for new CPAP mask.  Called pt, LM with wife. Requested that we contact the patient on his cell #  LM for pt on his cell [phone] (C)

## 2016-01-28 NOTE — Telephone Encounter (Signed)
Michael Buck can you please advise where we can place this pt. Thanks.

## 2016-01-28 NOTE — Telephone Encounter (Signed)
Please have patient come in for OV with CY on Wednesday 02-02-16 at 11:30am. Thanks

## 2016-01-28 NOTE — Telephone Encounter (Signed)
Thursday 02-10-16 at 4:15pm will be the next opening.

## 2016-01-28 NOTE — Telephone Encounter (Signed)
Spoke with the pt  He states he can not come in this day, can only do a thurs or Friday pm  Please advise

## 2016-01-28 NOTE — Telephone Encounter (Signed)
Spoke with the pt and offered appt 6/1 He refused this one, and now wants to come in on 5/24  Nothing further needed

## 2016-02-02 ENCOUNTER — Ambulatory Visit (INDEPENDENT_AMBULATORY_CARE_PROVIDER_SITE_OTHER): Payer: BLUE CROSS/BLUE SHIELD | Admitting: Internal Medicine

## 2016-02-02 ENCOUNTER — Encounter: Payer: Self-pay | Admitting: Internal Medicine

## 2016-02-02 VITALS — BP 120/78 | HR 54 | Ht 73.0 in | Wt 250.8 lb

## 2016-02-02 DIAGNOSIS — I480 Paroxysmal atrial fibrillation: Secondary | ICD-10-CM | POA: Diagnosis not present

## 2016-02-02 DIAGNOSIS — G4733 Obstructive sleep apnea (adult) (pediatric): Secondary | ICD-10-CM

## 2016-02-02 NOTE — Patient Instructions (Signed)
Order- referral to daytime sleep tech for CPAP mask comfort and compliance help    Dx OSA  Order- DME APS- please do CPAP download for pressure compliance. Please help with CPAP mask fit and comfort- mask of choice      Dx OSA  Suggest- try Tylenol PM at bedtime if needed, to help you sleep a little more restfully  Please call as needed

## 2016-02-02 NOTE — Assessment & Plan Note (Signed)
Pulse feels regular today. Not sure what workup has been done.

## 2016-02-02 NOTE — Assessment & Plan Note (Signed)
Fragmented sleep with much tossing and turning. Of we can calm this a little it may help him tolerate CPAP well enough for that to demonstrate its benefit. Plan-referral to daytime sleep technician for CPAP mask fitting. We will also ask help with this from his DME company and obtain download. Tried occasional Tylenol PM if needed for sleep.

## 2016-02-02 NOTE — Progress Notes (Signed)
02/21/13- 22 yoM former smoker referred courtesy of Dr Einar Gip for sleep medicine evaluation. He needed a DOT physical and was required to have a sleep study following that. He admits easy sleep onset if he sits quietly but insists that he has no problem staying alert while driving. History of snoring. 3 cups of coffee per day. No ENT surgery and no history of nasal fracture. Sleep onset between 9 and 10 PM, sleep latency 5 minutes, nocturia x3 before up at 4 AM. Medical history of atrial fibrillation/ eliquis, hypertension. He has a rescue inhaler which he rarely uses, but he says he's never been diagnosed with lung disease. 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 Family history that both parents died with myocardial infarction. Father snored loudly. NPSG 12/13/12- severe obstructive sleep apnea/hypoxia syndrome, AHI 93 per hour with body weight 255 pounds. CPAP titrated to 16 for AHI 3.9 per hour. Well tolerated.  04/10/13- 23 yoM former smoker referred courtesy of Dr Einar Gip for sleep medicine evaluation. He needed a DOT physical and was required to have a sleep study following that FOLLOWS FOR: Pt reports struggling with CPAP machine. Wears CPAP auto/ APS nightly x 5-6hrs. Pt denies problems with pressure. Pt has new mask full face mask rather than nasal mask.  Had cardioversion/ Dr Einar Gip. Toss and turn pulls CPAP hose. Sleeps better with it and wife confirms he doesn't snore.   08/14/13- 16 yoM former smoker referred courtesy of Dr Einar Gip for sleep medicine evaluation. FOLLOWS FOR:  Wears CPAP auto/ APS  every night for about 3-6 hours; DME is  APS Comfortable with CPAP pressure of 5-20, full face mask. He does follow some air and get "gas" but it does stop his snoring.  02/02/2016-65 year old male former smoker followed for OSA, complicated by HBP, PAFib CPAP auto/APS FOLLOWS FOR: Last seen 2014; DME:[facility]. Pt has not had any downloads recently-will need to send order  for New masks-unable to keep on his face. Will also need to place order for DL. Fullface mask has been uncomfortable and he pulls it off a lot at night. 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 False teeth, so he is not a candidate for an oral appliance.  ROS-see HPI Constitutional:   No-   weight loss, night sweats, fevers, chills, fatigue, lassitude. HEENT:   No-  headaches, difficulty swallowing, tooth/dental problems, sore throat,       No-  sneezing, itching, ear ache, nasal congestion, post nasal drip,  CV:  No-   chest pain, orthopnea, PND, swelling in lower extremities, anasarca,  dizziness, palpitations Resp: No-   shortness of breath with exertion or at rest.              No-   productive cough,  No non-productive cough,  No- coughing up of blood.              No-   change in color of mucus.  No- wheezing.   Skin: No-   rash or lesions. GI:  No-   heartburn, indigestion, abdominal pain, nausea, vomiting,  GU:  MS:  No-   joint pain or swelling.   Neuro-     nothing unusual Psych:  No- change in mood or affect. No depression or anxiety.  No memory loss.  OBJ- Physical Exam General- Alert, Oriented, Affect-appropriate, Distress- none acute.  Skin- rash-none, lesions- none, excoriation- none Lymphadenopathy- none Head- atraumatic  Eyes- Gross vision intact, PERRLA, conjunctivae and secretions clear            Ears- Hearing, canals-normal            Nose- Clear, no-Septal dev, mucus, polyps, erosion, perforation             Throat- Mallampati III , mucosa clear , drainage- none, tonsils- atrophic. Dentures Neck- flexible , trachea midline, no stridor , thyroid nl, carotid no bruit Chest - symmetrical excursion , unlabored           Heart/CV- Regular rate and rhythm by palpation today, no murmur , no gallop  , no rub, nl s1 s2                           - JVD- none , edema- none, stasis changes- none, varices- none           Lung- clear to P&A, wheeze- none, cough- none ,  dullness-none, rub- none           Chest wall-  Abd-  Br/ Gen/ Rectal- Not done, not indicated Extrem- cyanosis- none, clubbing, none, atrophy- none, strength- nl Neuro- grossly intact to observation

## 2016-02-10 ENCOUNTER — Telehealth: Payer: Self-pay | Admitting: Internal Medicine

## 2016-02-10 NOTE — Telephone Encounter (Signed)
Download has been done. This has been given to [employer].  CY - please advise on download results. Thanks.

## 2016-02-10 NOTE — Telephone Encounter (Signed)
CPAP download noted and filed

## 2016-02-11 NOTE — Telephone Encounter (Signed)
Please advise on download results.  Thanks!

## 2016-02-16 NOTE — Telephone Encounter (Signed)
CY please advise on download results. Thanks.

## 2016-02-18 ENCOUNTER — Encounter: Payer: Self-pay | Admitting: Internal Medicine

## 2016-02-22 NOTE — Telephone Encounter (Signed)
I do not see any notes/TE in chart about the download or any recommendations.     CY please advise if you have received download and of any recommendations. Thank you!

## 2016-07-10 ENCOUNTER — Encounter: Payer: Self-pay | Admitting: Gastroenterology

## 2016-07-20 ENCOUNTER — Encounter: Payer: Self-pay | Admitting: Gastroenterology

## 2017-02-23 ENCOUNTER — Encounter (INDEPENDENT_AMBULATORY_CARE_PROVIDER_SITE_OTHER): Payer: Self-pay

## 2017-02-23 ENCOUNTER — Encounter: Payer: Self-pay | Admitting: Gastroenterology

## 2017-02-23 ENCOUNTER — Ambulatory Visit (INDEPENDENT_AMBULATORY_CARE_PROVIDER_SITE_OTHER): Payer: Medicare HMO | Admitting: Gastroenterology

## 2017-02-23 VITALS — BP 120/80 | HR 107 | Ht 73.0 in | Wt 250.0 lb

## 2017-02-23 DIAGNOSIS — Z7901 Long term (current) use of anticoagulants: Secondary | ICD-10-CM | POA: Insufficient documentation

## 2017-02-23 DIAGNOSIS — Z86010 Personal history of colonic polyps: Secondary | ICD-10-CM | POA: Diagnosis not present

## 2017-02-23 MED ORDER — NA SULFATE-K SULFATE-MG SULF 17.5-3.13-1.6 GM/177ML PO SOLN
1.0000 | ORAL | 0 refills | Status: DC
Start: 2017-02-23 — End: 2017-08-01

## 2017-02-23 NOTE — Progress Notes (Signed)
02/23/2017 Michael Buck [id number] 10/12/1950   HISTORY OF PRESENT ILLNESS:  This is a 66 year old male who is known to [name].  His last colonoscopy was in January 2013. At that time he was found to have 5 polyps that were removed.  A couple were serrated adenomas and other were hyperplastic polyps. It was recommended that he have a repeat colonoscopy in 5 years from that time. He presents to our office today to schedule that procedure. He does not have any GI complaints.  He was just recently placed on Xarelto for recurrent atrial fibrillation.  He sees his cardiologist again in a couple of weeks. He thinks that they are potentially planning repeat cardioversion. I discussed with him regarding the need to hold the Xarelto for colonoscopy.  Past Medical History:  Diagnosis Date  . Hypertension   . Obesity    Past Surgical History:  Procedure Laterality Date  . APPENDECTOMY    . CARDIOVERSION N/A 12/03/2012   Procedure: CARDIOVERSION;  Surgeon: Laverda Page, MD;  Location: [facility];  Service: Cardiovascular;  Laterality: N/A;  . CARDIOVERSION N/A 04/08/2013   Procedure: CARDIOVERSION;  Surgeon: Laverda Page, MD;  Location: [facility];  Service: Cardiovascular;  Laterality: N/A;    reports that he has quit smoking. He has never used smokeless tobacco. He reports that he does not drink alcohol or use drugs. family history includes Diabetes in his father; Heart attack in his father; Hypertension in his father; Stroke in his mother. No Known Allergies    Outpatient Encounter Prescriptions as of 02/23/2017  Medication Sig  . atorvastatin (LIPITOR) 20 MG tablet Take 20 mg by mouth daily.  Marland Kitchen buPROPion (WELLBUTRIN SR) 150 MG 12 hr tablet Take 150 mg by mouth daily.  Marland Kitchen diltiazem (CARDIZEM CD) 180 MG 24 hr capsule Take 180 mg by mouth daily.  . flecainide (TAMBOCOR) 100 MG tablet Take 1 tablet by mouth 2 (two) times daily.  . rivaroxaban (XARELTO) 20 MG TABS tablet  Take 20 mg by mouth daily with supper.  . valsartan-hydrochlorothiazide (DIOVAN-HCT) 160-12.5 MG tablet Take 1 tablet by mouth daily.  . Na Sulfate-K Sulfate-Mg Sulf 17.5-3.13-1.6 GM/180ML SOLN Take 1 kit by mouth as directed.  . [DISCONTINUED] aspirin 81 MG tablet Take 81 mg by mouth daily.  . [DISCONTINUED] lisinopril-hydrochlorothiazide (PRINZIDE,ZESTORETIC) 20-12.5 MG tablet Take 1 tablet by mouth daily.   No facility-administered encounter medications on file as of 02/23/2017.      REVIEW OF SYSTEMS  : All other systems reviewed and negative except where noted in the History of Present Illness.   PHYSICAL EXAM: BP 120/80   Pulse (!) 107   Ht _0  (1.854 m)   Wt 250 lb (113.4 kg)   SpO2 98%   BMI 32.98 kg/m  General: Well developed white male in no acute distress Head: Normocephalic and atraumatic Eyes:  Sclerae anicteric, conjunctiva pink. Ears: Normal auditory acuity Lungs: Clear throughout to auscultation; no increased WOB. Heart: Irregularly irregular. Abdomen: Soft, non-distended.  Normal bowel sounds.  Non-tender. Rectal:  Will be done at the time of colonoscopy. Musculoskeletal: Symmetrical with no gross deformities  Skin: No lesions on visible extremities Extremities: No edema  Neurological: Alert oriented x 4, grossly non-focal Psychological:  Alert and cooperative. Normal mood and affect  ASSESSMENT AND PLAN: -Personal history of colon polyps:  Tubular adenomas in 2013.  Will schedule for colonoscopy with [name]. -Chronic anticoagulation for atrial fibrillation:  Just recently placed  on Xarelto for recurrent atrial fibrillation.  He sees his cardiologist again in a couple of weeks. He thinks that they are potentially planning repeat cardioversion. I discussed with him regarding the need to hold the Xarelto. We will go ahead and schedule colonoscopy and we'll push it out to the end of August. He will discussed with Dr. Einar Gip regarding all this and if he continues  on the Xarelto then we can plan to get approval to hold that for 2 days prior to his colonoscopy in a couple of months.   CC:  No ref. provider found

## 2017-02-23 NOTE — Patient Instructions (Signed)

## 2017-02-25 NOTE — Progress Notes (Signed)
Reviewed and agree with management plan.  Sullivan Blasing T. Adalai Perl, MD FACG 

## 2017-04-04 ENCOUNTER — Telehealth: Payer: Self-pay | Admitting: Emergency Medicine

## 2017-04-04 NOTE — Telephone Encounter (Signed)
Received fax from Dr. Einar Gip office ok to hold Xarelto for 48 hours prior to colonoscopy and restart 4 days post procedure if polyps removed.   Patient informed.

## 2017-04-25 ENCOUNTER — Encounter: Payer: Self-pay | Admitting: Gastroenterology

## 2017-05-09 ENCOUNTER — Ambulatory Visit: Payer: Medicare HMO | Admitting: Gastroenterology

## 2017-05-09 ENCOUNTER — Encounter: Payer: Self-pay | Admitting: *Deleted

## 2017-05-09 MED ORDER — SODIUM CHLORIDE 0.9 % IV SOLN: 500.0000 mL | INTRAVENOUS | Status: DC

## 2017-05-09 NOTE — Progress Notes (Addendum)
Patient came into clinic this morning for his scheduled colonoscopy. After reviewing his medications it was evident that he has not stopped his Xarelto for the procedure. After discussing this with the patient he said no one told him to stop the medication and in fact Dr. Irven Shelling assistant said it was ok to continue with the medication without holding it. I spoke with [name] about this and he said that we would need to reschedule this procedure for another date after the patient has held the Xarelto for 48 hours. This information was relayed to the patient and he was not happy because he has taken off of work and prepped. I apologized numerous times and explained the reasoning behind the decision to cancel his procedure. He understands and is going to reschedule. After looking into his chart the GI PA did tell him that we would contact Dr. Irven Shelling office for approval to hold Xarelto for 2 days. In the chart there is notification that Dr. Irven Shelling office did fax a recommendation to hold the medication for 48 hours and we contacted the patient to tell him to hold Xarelto. We are going to give him another prep and reschedule his colonoscopy.

## 2017-05-28 ENCOUNTER — Telehealth: Payer: Self-pay | Admitting: Gastroenterology

## 2017-05-28 ENCOUNTER — Encounter: Payer: Medicare HMO | Admitting: Gastroenterology

## 2017-05-28 NOTE — Telephone Encounter (Signed)
Please charge. Can cancel charge if he provides a valid reason.

## 2017-05-29 NOTE — Telephone Encounter (Signed)
Mr Michael Buck Colonoscopy due to [employer] and Power Outage. Will call back to reschedule once he is all settled.

## 2017-06-11 ENCOUNTER — Telehealth: Payer: Self-pay | Admitting: Gastroenterology

## 2017-06-11 NOTE — Telephone Encounter (Signed)
No he does not need a repeat pre-visit.  He was given instructions recently.

## 2017-08-01 ENCOUNTER — Ambulatory Visit (AMBULATORY_SURGERY_CENTER): Payer: Medicare HMO | Admitting: Gastroenterology

## 2017-08-01 ENCOUNTER — Other Ambulatory Visit: Payer: Self-pay

## 2017-08-01 ENCOUNTER — Encounter: Payer: Self-pay | Admitting: Gastroenterology

## 2017-08-01 VITALS — BP 104/61 | HR 74 | Temp 96.6°F | Resp 15 | Ht 73.0 in | Wt 250.0 lb

## 2017-08-01 DIAGNOSIS — D128 Benign neoplasm of rectum: Secondary | ICD-10-CM | POA: Diagnosis not present

## 2017-08-01 DIAGNOSIS — D123 Benign neoplasm of transverse colon: Secondary | ICD-10-CM

## 2017-08-01 DIAGNOSIS — K621 Rectal polyp: Secondary | ICD-10-CM | POA: Diagnosis not present

## 2017-08-01 DIAGNOSIS — Z86010 Personal history of colonic polyps: Secondary | ICD-10-CM | POA: Diagnosis present

## 2017-08-01 MED ORDER — SODIUM CHLORIDE 0.9 % IV SOLN: 500.0000 mL | INTRAVENOUS | Status: DC

## 2017-08-01 NOTE — Progress Notes (Signed)
Report to PACU, RN, vss, BBS= Clear.  

## 2017-08-01 NOTE — Progress Notes (Signed)
Called to room to assist during endoscopic procedure.  Patient ID and intended procedure confirmed with present staff. Received instructions for my participation in the procedure from the performing physician.  

## 2017-08-01 NOTE — Op Note (Signed)
[facility] Patient Name: Michael Buck Procedure Date: 08/01/2017 7:56 AM MRN: [id number] Endoscopist: Ladene Artist , MD Age: 66 Referring MD:  Date of Birth: October 08, 1950 Gender: Male Account #: [id number] Procedure:                Colonoscopy Indications:              High risk colon cancer surveillance: Personal                            history of sessile serrated colon polyp (less than                            10 mm in size) with no dysplasia Medicines:                Monitored Anesthesia Care Procedure:                Pre-Anesthesia Assessment:                           - Prior to the procedure, a History and Physical                            was performed, and patient medications and                            allergies were reviewed. The patient's tolerance of                            previous anesthesia was also reviewed. The risks                            and benefits of the procedure and the sedation                            options and risks were discussed with the patient.                            All questions were answered, and informed consent                            was obtained. Prior Anticoagulants: The patient has                            taken Xarelto (rivaroxaban), last dose was 2 days                            prior to procedure. ASA Grade Assessment: III - A                            patient with severe systemic disease. After                            reviewing the risks and benefits, the patient was  deemed in satisfactory condition to undergo the                            procedure.                           After obtaining informed consent, the colonoscope                            was passed under direct vision. Throughout the                            procedure, the patient's blood pressure, pulse, and                            oxygen saturations were monitored continuously. The                 Colonoscope was introduced through the anus and                            advanced to the the cecum, identified by                            appendiceal orifice and ileocecal valve. The                            ileocecal valve, appendiceal orifice, and rectum                            were photographed. The quality of the bowel                            preparation was excellent. The colonoscopy was                            performed without difficulty. The patient tolerated                            the procedure well. Scope In: 8:08:31 AM Scope Out: 8:23:55 AM Scope Withdrawal Time: 0 hours 13 minutes 40 seconds  Total Procedure Duration: 0 hours 15 minutes 24 seconds  Findings:                 The perianal and digital rectal examinations were                            normal.                           A 9 mm polyp was found in the transverse colon. The                            polyp was sessile. The polyp was removed with a hot  snare. Resection and retrieval were complete.                           A 4 mm polyp was found in the transverse colon. The                            polyp was sessile. The polyp was removed with a                            cold biopsy forceps. Resection and retrieval were                            complete.                           A 6 mm polyp was found in the rectum. The polyp was                            sessile. The polyp was removed with a cold snare.                            Resection and retrieval were complete.                           The exam was otherwise without abnormality on                            direct and retroflexion views. Complications:            No immediate complications. Estimated blood loss:                            None. Estimated Blood Loss:     Estimated blood loss: none. Impression:               - One 9 mm polyp in the transverse colon, removed                             with a hot snare. Resected and retrieved.                           - One 4 mm polyp in the transverse colon, removed                            with a cold biopsy forceps. Resected and retrieved.                           - One 6 mm polyp in the rectum, removed with a cold                            snare. Resected and retrieved.                           - The examination was otherwise normal  on direct                            and retroflexion views. Recommendation:           - Repeat colonoscopy in 5 years for surveillance.                           - Resume Xarelto (rivaroxaban) tomorrow at prior                            dose. Refer to managing physician for further                            adjustment of therapy.                           - Patient has a contact number available for                            emergencies. The signs and symptoms of potential                            delayed complications were discussed with the                            patient. Return to normal activities tomorrow.                            Written discharge instructions were provided to the                            patient.                           - Resume previous diet.                           - Continue present medications.                           - Await pathology results.                           - No aspirin, ibuprofen, naproxen, or other                            non-steroidal anti-inflammatory drugs for 2 weeks                            after polyp removal. Ladene Artist, MD 08/01/2017 8:28:14 AM This report has been signed electronically.

## 2017-08-01 NOTE — Patient Instructions (Signed)
Discharge instructions given. Handout on polyps. No ibuprofen, naproxen, aspirin or other NSAIDs for two weeks. Resume Xarelto tomorrow at prior dose. YOU HAD AN ENDOSCOPIC PROCEDURE TODAY AT [facility]:   Refer to the procedure report that was given to you for any specific questions about what was found during the examination.  If the procedure report does not answer your questions, please call your gastroenterologist to clarify.  If you requested that your care partner not be given the details of your procedure findings, then the procedure report has been included in a sealed envelope for you to review at your convenience later.  YOU SHOULD EXPECT: Some feelings of bloating in the abdomen. Passage of more gas than usual.  Walking can help get rid of the air that was put into your GI tract during the procedure and reduce the bloating. If you had a lower endoscopy (such as a colonoscopy or flexible sigmoidoscopy) you may notice spotting of blood in your stool or on the toilet paper. If you underwent a bowel prep for your procedure, you may not have a normal bowel movement for a few days.  Please Note:  You might notice some irritation and congestion in your nose or some drainage.  This is from the oxygen used during your procedure.  There is no need for concern and it should clear up in a day or so.  SYMPTOMS TO REPORT IMMEDIATELY:   Following lower endoscopy (colonoscopy or flexible sigmoidoscopy):  Excessive amounts of blood in the stool  Significant tenderness or worsening of abdominal pains  Swelling of the abdomen that is new, acute  Fever of 100F or higher   For urgent or emergent issues, a gastroenterologist can be reached at any hour by calling [phone].   DIET:  We do recommend a small meal at first, but then you may proceed to your regular diet.  Drink plenty of fluids but you should avoid alcoholic beverages for 24 hours.  ACTIVITY:  You should plan to  take it easy for the rest of today and you should NOT DRIVE or use heavy machinery until tomorrow (because of the sedation medicines used during the test).    FOLLOW UP: Our staff will call the number listed on your records the next business day following your procedure to check on you and address any questions or concerns that you may have regarding the information given to you following your procedure. If we do not reach you, we will leave a message.  However, if you are feeling well and you are not experiencing any problems, there is no need to return our call.  We will assume that you have returned to your regular daily activities without incident.  If any biopsies were taken you will be contacted by phone or by letter within the next 1-3 weeks.  Please call us at [phone] if you have not heard about the biopsies in 3 weeks.    SIGNATURES/CONFIDENTIALITY: You and/or your care partner have signed paperwork which will be entered into your electronic medical record.  These signatures attest to the fact that that the information above on your After Visit Summary has been reviewed and is understood.  Full responsibility of the confidentiality of this discharge information lies with you and/or your care-partner.

## 2017-08-06 ENCOUNTER — Telehealth: Payer: Self-pay

## 2017-08-06 NOTE — Telephone Encounter (Signed)
  Follow up Call-  Call back number 08/01/2017  Post procedure Call Back phone  # [phone]  Permission to leave phone message Yes  Some recent data might be hidden     Patient questions:  Do you have a fever, pain , or abdominal swelling? No. Pain Score  0 *  Have you tolerated food without any problems? Yes.    Have you been able to return to your normal activities? Yes.    Do you have any questions about your discharge instructions: Diet   No. Medications  No. Follow up visit  No.  Do you have questions or concerns about your Care? No.  Actions: * If pain score is 4 or above: No action needed, pain <4.

## 2017-08-14 ENCOUNTER — Encounter: Payer: Self-pay | Admitting: Gastroenterology

## 2018-10-22 ENCOUNTER — Telehealth: Payer: Self-pay

## 2018-10-22 NOTE — Telephone Encounter (Signed)
Patient will need to reschedule 10/31/18 appointment with Jeri Lager. She will be out of the office

## 2018-10-29 ENCOUNTER — Telehealth: Payer: Self-pay

## 2018-10-31 ENCOUNTER — Encounter: Payer: Self-pay | Admitting: Cardiology

## 2018-10-31 ENCOUNTER — Ambulatory Visit (INDEPENDENT_AMBULATORY_CARE_PROVIDER_SITE_OTHER): Payer: Medicare HMO | Admitting: Cardiology

## 2018-10-31 VITALS — BP 131/76 | HR 64 | Ht 68.0 in | Wt 253.8 lb

## 2018-10-31 DIAGNOSIS — I10 Essential (primary) hypertension: Secondary | ICD-10-CM | POA: Diagnosis not present

## 2018-10-31 DIAGNOSIS — I4821 Permanent atrial fibrillation: Secondary | ICD-10-CM

## 2018-10-31 DIAGNOSIS — E7800 Pure hypercholesterolemia, unspecified: Secondary | ICD-10-CM | POA: Diagnosis not present

## 2018-10-31 DIAGNOSIS — G4733 Obstructive sleep apnea (adult) (pediatric): Secondary | ICD-10-CM | POA: Diagnosis not present

## 2018-10-31 NOTE — Progress Notes (Signed)
Subjective:  Primary Physician:  Leandrew Koyanagi, MD  Patient ID: Michael Buck, male    DOB: Sep 23, 1950, 68 y.o.   MRN: [id number]  Chief Complaint  Patient presents with  . Atrial Fibrillation  . Follow-up    HPI: Michael Buck  is a 68 y.o. male  with Permanent atrial fibrillation, previously used to present with marked fatigue with onset of atrial fibrillation, was on flecainide until 6 months ago when he presented For a routine visit and was found to be in atrial fibrillation.  As he was asymptomatic I discontinued flecainide.  He now presents here for 6 month office visit.  Past medical history significant for hypertension, hyperlipidemia, obstructive sleep apnea on CPAP and follows [facility].  No other specific symptoms today.  No GI bleeding complaints, normal bowels, no headache or visual disturbances, no chest pain or palpitations.  Past Medical History:  Diagnosis Date  . Atrial fibrillation ([place])   . Hypertension   . Obesity   . Sleep apnea     Past Surgical History:  Procedure Laterality Date  . APPENDECTOMY    . CARDIOVERSION N/A 12/03/2012   Procedure: CARDIOVERSION;  Surgeon: Laverda Page, MD;  Location: [facility];  Service: Cardiovascular;  Laterality: N/A;  . CARDIOVERSION N/A 04/08/2013   Procedure: CARDIOVERSION;  Surgeon: Laverda Page, MD;  Location: [facility] ENDOSCOPY;  Service: Cardiovascular;  Laterality: N/A;    Social History   Socioeconomic History  . Marital status: Married    Spouse name: Not on file  . Number of children: 1  . Years of education: Not on file  . Highest education level: Not on file  Occupational History  . Occupation: Clinical biochemist    Comment: Pensions consultant  . Financial resource strain: Not on file  . Food insecurity:    Worry: Not on file    Inability: Not on file  . Transportation needs:    Medical: Not on file    Non-medical: Not on file  Tobacco Use  . Smoking status: Former  Smoker    Packs/day: 1.50    Years: 40.00    Pack years: 60.00    Types: Cigarettes    Last attempt to quit: 2001    Years since quitting: 19.1  . Smokeless tobacco: Never Used  Substance and Sexual Activity  . Alcohol use: No    Comment: occ beer   . Drug use: No  . Sexual activity: Not on file  Lifestyle  . Physical activity:    Days per week: Not on file    Minutes per session: Not on file  . Stress: Not on file  Relationships  . Social connections:    Talks on phone: Not on file    Gets together: Not on file    Attends religious service: Not on file    Active member of club or organization: Not on file    Attends meetings of clubs or organizations: Not on file    Relationship status: Not on file  . Intimate partner violence:    Fear of current or ex partner: Not on file    Emotionally abused: Not on file    Physically abused: Not on file    Forced sexual activity: Not on file  Other Topics Concern  . Not on file  Social History Narrative   Daily caffeine     Current Outpatient Medications on File Prior to Visit  Medication Sig Dispense  Refill  . atorvastatin (LIPITOR) 20 MG tablet Take 20 mg by mouth daily.  3  . buPROPion (WELLBUTRIN SR) 150 MG 12 hr tablet Take 150 mg by mouth daily.    Marland Kitchen diltiazem (CARDIZEM CD) 180 MG 24 hr capsule Take 180 mg by mouth daily.    . rivaroxaban (XARELTO) 20 MG TABS tablet Take 20 mg by mouth daily with supper.    . valsartan-hydrochlorothiazide (DIOVAN-HCT) 160-12.5 MG tablet Take 1 tablet by mouth daily.     No current facility-administered medications on file prior to visit.     Review of Systems  Constitutional: Negative for malaise/fatigue and weight loss.  Respiratory: Negative for cough, hemoptysis and shortness of breath.   Cardiovascular: Negative for chest pain, palpitations, claudication and leg swelling.  Gastrointestinal: Negative for abdominal pain, blood in stool, constipation, heartburn and vomiting.   Genitourinary: Negative for dysuria.  Musculoskeletal: Negative for joint pain and myalgias.  Neurological: Negative for dizziness, focal weakness and headaches.  Endo/Heme/Allergies: Does not bruise/bleed easily.  Psychiatric/Behavioral: Negative for depression. The patient is not nervous/anxious.   All other systems reviewed and are negative.      Objective:  Blood pressure 131/76, pulse 64, height _0  (1.727 m), weight 253 lb 12.8 oz (115.1 kg), SpO2 95 %. Body mass index is 38.59 kg/m.  Physical Exam  Constitutional: He appears healthy. No distress.  Moderately obese  Eyes: Conjunctivae are normal.  Neck: Neck supple. No JVD present.  Cardiovascular: Normal heart sounds, intact distal pulses and normal pulses. An irregular rhythm present. Exam reveals no gallop.  No murmur heard. Pulmonary/Chest: Breath sounds normal. He exhibits no tenderness.  Abdominal: Soft. Bowel sounds are normal.  Musculoskeletal: Normal range of motion.  Neurological: He is alert and oriented to person, place, and time.  Skin: Skin is warm and dry.    CARDIAC STUDIES:  Echocardiogram 04/26/2017: Left ventricle cavity is normal in size. EF likely low normal Left atrial cavity is severely dilated. Structurally normal tricuspid valve with mild regurgitation. Pulmonary artery systolic pressure is normal.  Exercise myoview 11/08/12: 1.  EKG: Resting EKG showed A. Fibrillation, IRBBB, non specific ST and T changes. Stress EKG is negative for ischemia.  Patient exercised on Bruce protocol for 3 minutes 1 sec. The maximum work level achieved was 4.9 MET's. Accelerated heart rate with low level stress test. The test was terminated due to achievement of the target heart rate.  2. Perfusion images reveal a small sized inferior wall scar without significant superimposed ischemia. Dynamic gated images reveal mild decrease in Left ventricular ejection fraction which was estimated to be 49% 3. This represents a  low risk study.  Assessment & Recommendations:   1. Permanent atrial fibrillation EKG 10/31/18/20: Atrial fibrillation with controlled and corresponds at the rate of 60 bpm, left axis deviation, left anterior fascicular block.  Cannot exclude inferior infarct old.  Nonspecific T-wave flattening. - EKG 12-Lead - CBC, -TSH and T3 and T4.  2. Essential hypertension Controlled - Comprehensive metabolic panel  3. Obstructive sleep apnea On CPAP and compliant and states he needs to change his mask  4. Hypercholesteremia Previously controlled. Needs annual labs. Does not have a PCP.  - Lipid Panel With LDL/HDL Ratio  11/09/2017: Cholesterol 98, triglycerides 78, HDL 31, LDL 51. CBC normal. Creatinine 0.95, EGFR 84/97, potassium 4.4, CMP normal. TSH 5.2. PSA 0.7.  02/22/2017: Cholesterol 114, triglycerides 67, HDL 37, LDL 64. CBC normal. Creatinine 0.93, potassium 4.0, CMP normal. TSH 4.8.  01/31/2016:  Creatinine 0.98, potassium 4.0, BMP normal  08/26/2015: HbA1c 6.2%, total cholesterol 117, triglycerides 69, HDL 40, LDL 63, creatinine 0.82, potassium 4.5, CMP normal   Recommendation: Patient and permanent atrial fibrillation, essentially remains asymptomatic.  Moderate obesity continues to be a problem, we have again discussed regarding making lifestyle changes.  Previously he has had elevated TSH, will obtain thyroid panel.  Continue anticoagulation.  I'll see him back in 6 months.  Adrian Prows, MD, [facility] 11/01/2018, 8:13 AM [facility] [place] Pager: [phone] Office: [phone] If no answer Cell [phone]

## 2018-11-01 ENCOUNTER — Encounter: Payer: Self-pay | Admitting: Cardiology

## 2018-11-04 ENCOUNTER — Other Ambulatory Visit: Payer: Self-pay

## 2018-11-04 DIAGNOSIS — I480 Paroxysmal atrial fibrillation: Secondary | ICD-10-CM

## 2018-11-04 MED ORDER — RIVAROXABAN 20 MG PO TABS: 20.0000 mg | ORAL_TABLET | Freq: Every day | ORAL | 3 refills | Status: DC

## 2018-11-29 ENCOUNTER — Other Ambulatory Visit: Payer: Self-pay | Admitting: Cardiology

## 2019-01-30 ENCOUNTER — Other Ambulatory Visit: Payer: Self-pay | Admitting: Cardiology

## 2019-01-30 DIAGNOSIS — I480 Paroxysmal atrial fibrillation: Secondary | ICD-10-CM

## 2019-01-31 NOTE — Telephone Encounter (Signed)
Please fill

## 2019-02-13 ENCOUNTER — Other Ambulatory Visit: Payer: Self-pay | Admitting: Cardiology

## 2019-02-14 ENCOUNTER — Other Ambulatory Visit: Payer: Self-pay | Admitting: Cardiology

## 2019-05-01 ENCOUNTER — Other Ambulatory Visit: Payer: Self-pay | Admitting: Cardiology

## 2019-05-08 ENCOUNTER — Other Ambulatory Visit: Payer: Self-pay

## 2019-05-08 ENCOUNTER — Ambulatory Visit (INDEPENDENT_AMBULATORY_CARE_PROVIDER_SITE_OTHER): Payer: Medicare HMO | Admitting: Cardiology

## 2019-05-08 ENCOUNTER — Encounter: Payer: Self-pay | Admitting: Cardiology

## 2019-05-08 VITALS — BP 125/84 | HR 84 | Ht 73.0 in | Wt 266.0 lb

## 2019-05-08 DIAGNOSIS — I4821 Permanent atrial fibrillation: Secondary | ICD-10-CM | POA: Diagnosis not present

## 2019-05-08 DIAGNOSIS — E7800 Pure hypercholesterolemia, unspecified: Secondary | ICD-10-CM

## 2019-05-08 DIAGNOSIS — G4733 Obstructive sleep apnea (adult) (pediatric): Secondary | ICD-10-CM | POA: Diagnosis not present

## 2019-05-08 DIAGNOSIS — I10 Essential (primary) hypertension: Secondary | ICD-10-CM

## 2019-05-08 NOTE — Progress Notes (Signed)
Primary Physician/Referring:  Patient, No Pcp Per  Patient ID: Michael Buck, male    DOB: 1950/09/24, 68 y.o.   MRN: [id number]  Chief Complaint  Patient presents with  . Hypertension  . Atrial Fibrillation  . Follow-up   HPI:    Michael Buck  is a 68 y.o. Caucasian  male  with Permanent atrial fibrillation, previously used to present with marked fatigue with onset of atrial fibrillation, was on flecainide in the past.  He now presents here for 6 month office visit.  Past medical history significant for hypertension, hyperlipidemia, obstructive sleep apnea on CPAP and follows [facility].  No other specific symptoms today.  No GI bleeding complaints, normal bowels, no headache or visual disturbances, no chest pain or palpitations.  Past Medical History:  Diagnosis Date  . Atrial fibrillation ([place])   . Hypertension   . Obesity   . Sleep apnea    Past Surgical History:  Procedure Laterality Date  . APPENDECTOMY    . CARDIOVERSION N/A 12/03/2012   Procedure: CARDIOVERSION;  Surgeon: Laverda Page, MD;  Location: [place];  Service: Cardiovascular;  Laterality: N/A;  . CARDIOVERSION N/A 04/08/2013   Procedure: CARDIOVERSION;  Surgeon: Laverda Page, MD;  Location: [facility] ENDOSCOPY;  Service: Cardiovascular;  Laterality: N/A;   Social History   Socioeconomic History  . Marital status: Married    Spouse name: Not on file  . Number of children: 1  . Years of education: Not on file  . Highest education level: Not on file  Occupational History  . Occupation: Clinical biochemist    Comment: Pensions consultant  . Financial resource strain: Not on file  . Food insecurity    Worry: Not on file    Inability: Not on file  . Transportation needs    Medical: Not on file    Non-medical: Not on file  Tobacco Use  . Smoking status: Former Smoker    Packs/day: 1.50    Years: 40.00    Pack years: 60.00    Types: Cigarettes    Quit date: 2001    Years since  quitting: 19.6  . Smokeless tobacco: Never Used  Substance and Sexual Activity  . Alcohol use: Yes    Comment: seldom  . Drug use: No  . Sexual activity: Not on file  Lifestyle  . Physical activity    Days per week: Not on file    Minutes per session: Not on file  . Stress: Not on file  Relationships  . Social Herbalist on phone: Not on file    Gets together: Not on file    Attends religious service: Not on file    Active member of club or organization: Not on file    Attends meetings of clubs or organizations: Not on file    Relationship status: Not on file  . Intimate partner violence    Fear of current or ex partner: Not on file    Emotionally abused: Not on file    Physically abused: Not on file    Forced sexual activity: Not on file  Other Topics Concern  . Not on file  Social History Narrative   Daily caffeine    ROS  Review of Systems  Constitution: Negative for chills, decreased appetite, malaise/fatigue and weight gain.  Cardiovascular: Negative for dyspnea on exertion, leg swelling and syncope.  Endocrine: Negative for cold intolerance.  Hematologic/Lymphatic: Does not bruise/bleed easily.  Musculoskeletal: Negative for joint swelling.  Gastrointestinal: Negative for abdominal pain, anorexia, change in bowel habit, hematochezia and melena.  Neurological: Negative for headaches and light-headedness.  Psychiatric/Behavioral: Negative for depression and substance abuse.  All other systems reviewed and are negative.  Objective  Blood pressure 125/84, pulse 84, height '6\' 1"'$  (1.854 m), weight 266 lb (120.7 kg), SpO2 97 %. Body mass index is 35.09 kg/m.   Physical Exam  Constitutional: He appears well-developed. No distress.  Moderately obese and muscular  HENT:  Head: Atraumatic.  Eyes: Conjunctivae are normal.  Neck: Neck supple. No JVD present. No thyromegaly present.  Cardiovascular: Intact distal pulses and normal pulses. An irregularly  irregular rhythm present. Exam reveals no gallop, no S3 and no S4.  No murmur heard. S1 is variable, S2 is normal.  No edema.    Pulmonary/Chest: Effort normal and breath sounds normal.  Abdominal: Soft. Bowel sounds are normal.  Musculoskeletal: Normal range of motion.        General: No edema.  Neurological: He is alert.  Skin: Skin is warm and dry.  Psychiatric: He has a normal mood and affect.   Radiology: No results found.  Laboratory examination:    11/09/2017: Cholesterol 98, triglycerides 78, HDL 31, LDL 51. CBC normal. Creatinine 0.95, EGFR 84/97, potassium 4.4, CMP normal. TSH 5.2. PSA 0.7.  02/22/2017: Cholesterol 114, triglycerides 67, HDL 37, LDL 64. CBC normal. Creatinine 0.93, potassium 4.0, CMP normal. TSH 4.8.  No results for input(s): NA, K, CL, CO2, GLUCOSE, BUN, CREATININE, CALCIUM, GFRNONAA, GFRAA in the last 8760 hours. CMP Latest Ref Rng & Units 12/03/2012 10/22/2012 08/06/2007  Glucose 70 - 99 mg/dL 97 95 92  BUN 6 - 23 mg/dL 16 25(H) 13  Creatinine 0.50 - 1.35 mg/dL 0.74 0.91 0.78  Sodium 135 - 145 mEq/L 141 138 137  Potassium 3.5 - 5.1 mEq/L 4.1 4.5 3.8  Chloride 96 - 112 mEq/L 106 104 106  CO2 19 - 32 mEq/L '24 28 25  '$ Calcium 8.4 - 10.5 mg/dL 8.9 9.2 8.6  Total Protein 6.0 - 8.3 g/dL - 6.5 6.4  Total Bilirubin 0.3 - 1.2 mg/dL - 0.6 1.0  Alkaline Phos 39 - 117 U/L - 53 62  AST 0 - 37 U/L - 12 19  ALT 0 - 53 U/L - 14 18   CBC Latest Ref Rng & Units 10/22/2012 08/06/2007  WBC 4.6 - 10.2 K/uL 8.2 11.5(H)  Hemoglobin 14.1 - 18.1 g/dL 15.7 15.3  Hematocrit 43.5 - 53.7 % 47.2 45.3  Platelets - - 266   Lipid Panel     Component Value Date/Time   CHOL 188 10/22/2012 0908   TRIG 102 10/22/2012 0908   HDL 31 (L) 10/22/2012 0908   CHOLHDL 6.1 10/22/2012 0908   VLDL 20 10/22/2012 0908   LDLCALC 137 (H) 10/22/2012 0908   HEMOGLOBIN A1C Lab Results  Component Value Date   HGBA1C  08/06/2007    5.7 (NOTE)   The ADA recommends the following therapeutic  goals for glycemic   control related to Hgb A1C measurement:   Goal of Therapy:   < 7.0% Hgb A1C   Action Suggested:  > 8.0% Hgb A1C   Ref:  Diabetes Care, 22, Suppl. 1, 1999   MPG 126 08/06/2007   TSH No results for input(s): TSH in the last 8760 hours. Medications   Prior to Admission medications   Medication Sig Start Date End Date Taking? Authorizing Provider  atorvastatin (LIPITOR) 20 MG tablet TAKE  1 TABLET EVERY DAY 01/31/19   Adrian Prows, MD  buPROPion [facility] SR) 150 MG 12 hr tablet TAKE 1 TABLET EVERY DAY 05/02/19   Adrian Prows, MD  diltiazem (CARDIZEM CD) 240 MG 24 hr capsule TAKE 1 CAPSULE EVERY DAY 05/02/19   Adrian Prows, MD  metoprolol succinate (TOPROL-XL) 100 MG 24 hr tablet TAKE 1/2 TABLET EVERY DAY 12/02/18   Adrian Prows, MD  valsartan-hydrochlorothiazide (DIOVAN-HCT) 160-12.5 MG tablet TAKE 1 TABLET EVERY MORNING 01/31/19   Adrian Prows, MD  XARELTO 20 MG TABS tablet TAKE 1 TABLET EVERY DAY WITH SUPPER 01/31/19   Adrian Prows, MD     Current Outpatient Medications  Medication Instructions  . atorvastatin (LIPITOR) 20 MG tablet TAKE 1 TABLET EVERY DAY  . buPROPion (WELLBUTRIN SR) 150 MG 12 hr tablet TAKE 1 TABLET EVERY DAY  . diltiazem (CARDIZEM CD) 240 MG 24 hr capsule TAKE 1 CAPSULE EVERY DAY  . metoprolol succinate (TOPROL-XL) 100 MG 24 hr tablet TAKE 1/2 TABLET EVERY DAY  . valsartan-hydrochlorothiazide (DIOVAN-HCT) 160-12.5 MG tablet TAKE 1 TABLET EVERY MORNING  . XARELTO 20 MG TABS tablet TAKE 1 TABLET EVERY DAY WITH SUPPER    Cardiac Studies:   Echocardiogram 04/26/2017: Left ventricle cavity is normal in size. EF likely low normal Left atrial cavity is severely dilated. Structurally normal tricuspid valve with mild regurgitation. Pulmonary artery systolic pressure is normal.  Exercise myoview 11/08/12: 1.  EKG: Resting EKG showed A. Fibrillation, IRBBB, non specific ST and T changes. Stress EKG is negative for ischemia.  Patient exercised on Bruce protocol for 3  minutes 1 sec. The maximum work level achieved was 4.9 MET's. Accelerated heart rate with low level stress test. The test was terminated due to achievement of the target heart rate.  2. Perfusion images reveal a small sized inferior wall scar without significant superimposed ischemia. Dynamic gated images reveal mild decrease in Left ventricular ejection fraction which was estimated to be 49% 3. This represents a low risk study.  Assessment     ICD-10-CM   1. Permanent atrial fibrillation  I48.21 EKG 12-Lead   CHA2DS2-VASc Score is 2.  Yearly risk of stroke: 2.2%.     2. Essential hypertension  I10   3. Obstructive sleep apnea  G47.33   4. Hypercholesteremia  E78.00     EKG 05/08/2019: Atrial fibrillation with controlled ventricular response at the rate of 70 bpm, left axis deviation, left anterior fascicular block.  Low-voltage complexes.  Normal QT interval. No significant change from  EKG 10/31/18/20.  Recommendations:   Patient presents here for follow-up of permanent atrial fibrillation, hyperlipidemia.  He is presently doing well but since he quit smoking, has gained 35 pounds in weight and has not been able to get rid of the weight.  I have extensively discussed with him regarding plant-based diet.  I will like to see him back in 6 months for follow-up.  Labs are ordered, however he was unable to get him due to Covid 19 but he'll make an appointment to go get them done in the lab.  Blood pressure is also well controlled on his present medical regimen.  Adrian Prows, MD, [facility] 05/08/2019, 3:45 PM [facility]. [place] Pager: [phone] Office: [phone] If no answer Cell [phone]

## 2019-07-29 ENCOUNTER — Other Ambulatory Visit: Payer: Self-pay | Admitting: Cardiology

## 2019-11-03 ENCOUNTER — Other Ambulatory Visit: Payer: Self-pay | Admitting: Cardiology

## 2019-11-03 DIAGNOSIS — I480 Paroxysmal atrial fibrillation: Secondary | ICD-10-CM

## 2019-11-20 ENCOUNTER — Ambulatory Visit: Payer: Medicare HMO | Admitting: Cardiology

## 2019-11-20 ENCOUNTER — Other Ambulatory Visit: Payer: Self-pay

## 2019-11-20 ENCOUNTER — Encounter: Payer: Self-pay | Admitting: Cardiology

## 2019-11-20 VITALS — BP 109/86 | HR 93 | Temp 97.7°F | Resp 16 | Ht 73.0 in | Wt 268.0 lb

## 2019-11-20 DIAGNOSIS — N528 Other male erectile dysfunction: Secondary | ICD-10-CM

## 2019-11-20 DIAGNOSIS — I4821 Permanent atrial fibrillation: Secondary | ICD-10-CM

## 2019-11-20 DIAGNOSIS — E7800 Pure hypercholesterolemia, unspecified: Secondary | ICD-10-CM

## 2019-11-20 DIAGNOSIS — G4733 Obstructive sleep apnea (adult) (pediatric): Secondary | ICD-10-CM

## 2019-11-20 DIAGNOSIS — I10 Essential (primary) hypertension: Secondary | ICD-10-CM

## 2019-11-20 MED ORDER — SILDENAFIL CITRATE 100 MG PO TABS: 100.0000 mg | ORAL_TABLET | Freq: Every day | ORAL | 6 refills | Status: DC | PRN

## 2019-11-20 NOTE — Progress Notes (Signed)
Primary Physician/Referring:  Patient, No Pcp Per  Patient ID: TYHEIM VANALSTYNE, male    DOB: 1950/09/13, 69 y.o.   MRN: [id number]  Chief Complaint  Patient presents with  . Atrial Fibrillation  . Hyperlipidemia  . Follow-up    6 month   HPI:    BLAYDE BACIGALUPI  is a 69 y.o. Caucasian  male  with Permanent atrial fibrillation, previously used to present with marked fatigue with onset of atrial fibrillation, was on flecainide in the past, but was back in persistent atrial fibrillation but was asymptomatic.  It was decided that rate control strategy was appropriate, also the left atrium was severely dilated by echocardiogram in 2018 and he has obesity and sleep apnea as risk factors.  He now presents for 88-monthfollow-up.  Remains asymptomatic.  Past Medical History:  Diagnosis Date  . [place]   . Hypertension   . Obesity   . Sleep apnea    Past Surgical History:  Procedure Laterality Date  . APPENDECTOMY    . CARDIOVERSION N/A 12/03/2012   Procedure: CARDIOVERSION;  Surgeon: JLaverda Page MD;  [place]  Service: Cardiovascular;  Laterality: N/A;  . CARDIOVERSION N/A 04/08/2013   Procedure: CARDIOVERSION;  Surgeon: JLaverda Page MD;  [facility];  Service: Cardiovascular;  Laterality: N/A;   Family History  Problem Relation Age of Onset  . Stroke Mother   . Hypertension Father   . Diabetes Father   . Heart attack Father   . Cancer Sister   . Colon cancer Neg Hx   . Esophageal cancer Neg Hx   . Stomach cancer Neg Hx   . Rectal cancer Neg Hx     Social History   Tobacco Use  . Smoking status: Former Smoker    Packs/day: 1.50    Years: 40.00    Pack years: 60.00    Types: Cigarettes    Quit date: 2001    Years since quitting: 20.2  . Smokeless tobacco: Never Used  Substance Use Topics  . Alcohol use: Yes    Comment: seldom   ROS  Review of Systems  Cardiovascular: Negative for chest pain, dyspnea on exertion  and leg swelling.  Gastrointestinal: Negative for melena.   Objective  Blood pressure 109/86, pulse 93, temperature 97.7 F (36.5 C), temperature source Temporal, resp. rate 16, height '6\' 1"'$  (1.854 m), weight 286 lb 12.8 oz (130.1 kg), SpO2 98 %.  Vitals with BMI 11/20/2019 05/08/2019 10/31/2018  Height '6\' 1"'$  '6\' 1"'$  '5\' 8"'$   Weight 286 lbs 13 oz 266 lbs 253 lbs 13 oz  BMI 37.85 303.5346.5 Systolic [id number] Diastolic 86 84 76  Pulse 93 84 64     Physical Exam  HENT:  Head: Atraumatic.  Cardiovascular: Intact distal pulses and normal pulses. An irregularly irregular rhythm present. Exam reveals no gallop, no S3 and no S4.  No murmur heard. S1 is variable, S2 is normal. No JVD, No leg edema.  Pulmonary/Chest: Effort normal and breath sounds normal.  Abdominal: Soft. Bowel sounds are normal.   Laboratory examination:   No results for input(s): NA, K, CL, CO2, GLUCOSE, BUN, CREATININE, CALCIUM, GFRNONAA, GFRAA in the last 8760 hours. CrCl cannot be calculated (Patient's most recent lab result is older than the maximum 21 days allowed.).  CMP Latest Ref Rng & Units 12/03/2012 10/22/2012 08/06/2007  Glucose 70 - 99 mg/dL 97 95 92  BUN 6 - 23 mg/dL 16  25(H) 13  Creatinine 0.50 - 1.35 mg/dL 0.74 0.91 0.78  Sodium 135 - 145 mEq/L 141 138 137  Potassium 3.5 - 5.1 mEq/L 4.1 4.5 3.8  Chloride 96 - 112 mEq/L 106 104 106  CO2 19 - 32 mEq/L '24 28 25  '$ Calcium 8.4 - 10.5 mg/dL 8.9 9.2 8.6  Total Protein 6.0 - 8.3 g/dL - 6.5 6.4  Total Bilirubin 0.3 - 1.2 mg/dL - 0.6 1.0  Alkaline Phos 39 - 117 U/L - 53 62  AST 0 - 37 U/L - 12 19  ALT 0 - 53 U/L - 14 18   CBC Latest Ref Rng & Units 10/22/2012 08/06/2007  WBC 4.6 - 10.2 K/uL 8.2 11.5(H)  Hemoglobin 14.1 - 18.1 g/dL 15.7 15.3  Hematocrit 43.5 - 53.7 % 47.2 45.3  Platelets - - 266   Lipid Panel     Component Value Date/Time   CHOL 188 10/22/2012 0908   TRIG 102 10/22/2012 0908   HDL 31 (L) 10/22/2012 0908   CHOLHDL 6.1 10/22/2012 0908    VLDL 20 10/22/2012 0908   LDLCALC 137 (H) 10/22/2012 0908   HEMOGLOBIN A1C Lab Results  Component Value Date   HGBA1C  08/06/2007    5.7 (NOTE)   The ADA recommends the following therapeutic goals for glycemic   control related to Hgb A1C measurement:   Goal of Therapy:   < 7.0% Hgb A1C   Action Suggested:  > 8.0% Hgb A1C   Ref:  Diabetes Care, 22, Suppl. 1, 1999   MPG 126 08/06/2007   TSH No results for input(s): TSH in the last 8760 hours.  External labs:   Cholesterol, total 98.000 M 11/08/2017 HDL 31.000 M 11/08/2017 LDL   Triglycerides 78.000 M 11/08/2017  Hemoglobin 14.700 G/ 11/08/2017; Platelets 227.000 X 11/08/2017  Creatinine, Serum 0.950 MG/ 11/08/2017 Potassium 4.000 MM 01/31/2016 ALT (SGPT) 24.000 IU/ 11/08/2017 TSH 5.290 11/08/2017  Medications and allergies  No Known Allergies   Current Outpatient Medications  Medication Instructions  . atorvastatin (LIPITOR) 20 MG tablet TAKE 1 TABLET EVERY DAY  . buPROPion (WELLBUTRIN SR) 150 MG 12 hr tablet TAKE 1 TABLET EVERY DAY  . diltiazem (CARDIZEM CD) 240 MG 24 hr capsule TAKE 1 CAPSULE EVERY DAY  . metoprolol succinate (TOPROL-XL) 100 MG 24 hr tablet TAKE 1/2 TABLET EVERY DAY  . valsartan-hydrochlorothiazide (DIOVAN-HCT) 160-12.5 MG tablet TAKE 1 TABLET EVERY MORNING  . XARELTO 20 MG TABS tablet TAKE 1 TABLET EVERY DAY  WITH  SUPPER   Radiology:   No results found.  Cardiac Studies:   Echocardiogram 04/26/2017: Left ventricle cavity is normal in size. EF likely low normal Left atrial cavity is severely dilated. Structurally normal tricuspid valve with mild regurgitation. Pulmonary artery systolic pressure is normal.  Exercise myoview 11/08/12: 1.  EKG: Resting EKG showed A. Fibrillation, IRBBB, non specific ST and T changes. Stress EKG is negative for ischemia.  Patient exercised on Bruce protocol for 3 minutes 1 sec. The maximum work level achieved was 4.9 MET's. Accelerated heart rate with low level stress test.  The test was terminated due to achievement of the target heart rate.  2. Perfusion images reveal a small sized inferior wall scar without significant superimposed ischemia. Dynamic gated images reveal mild decrease in Left ventricular ejection fraction which was estimated to be 49% 3. This represents a low risk study.  EKG  EKG 11/20/2019: Atrial fibrillation with controlled ventricular response at the rate of 92 bpm, left axis deviation complaint of  fascicular block.  Diffuse nonspecific T abnormality. No significant change from EKG  EKG 05/08/2019   Assessment     ICD-10-CM   1. Permanent atrial fibrillation ([place]). CHA2DS2-VASc Score is 2.  Yearly risk of stroke: 2.2%.    I48.21 EKG 12-Lead  2. Essential hypertension  I10   3. Obstructive sleep apnea  G47.33 Ambulatory referral to Sleep Studies  4. Hypercholesteremia  E78.00      No orders of the defined types were placed in this encounter.   There are no discontinued medications.  Recommendations:   GREYDON BETKE  is a 69 y.o. Caucasian  male  with Permanent atrial fibrillation, previously used to present with marked fatigue with onset of atrial fibrillation, was on flecainide in the past, but was back in persistent atrial fibrillation but was asymptomatic.  It was decided that rate control strategy was appropriate, also the left atrium was severely dilated by echocardiogram in 2018 and he has obesity and sleep apnea as risk factors.  He now presents for 36-monthfollow-up.  Patient was supposed to get labs, she has not had any recent labs.  He has been skeptical about going to the lab, advised him that is much safer going to the lab to get your blood drawn and taking the medication without knowing potential complications of anticoagulation and also hypertension.  He is now willing to go for this.  He has not had sleep evaluation in the past 5 years, I will make referral back to Dr. CBaird Lyons  No changes in the medications were done  today.  Blood pressures well controlled, A. fib rate is well controlled, I will see him back in 6 months for follow-up and I will follow up on his lipids.  JAdrian Prows MD, [facility]3/07/2020, 3:14 PM POak ParkCardiovascular. PGarrisonOffice: [phone]

## 2019-11-25 ENCOUNTER — Other Ambulatory Visit: Payer: Self-pay | Admitting: Cardiology

## 2019-11-26 LAB — COMPREHENSIVE METABOLIC PANEL
ALT: 18 IU/L (ref 0–44)
AST: 19 IU/L (ref 0–40)
Albumin/Globulin Ratio: 2.3 — ABNORMAL HIGH (ref 1.2–2.2)
Albumin: 4.5 g/dL (ref 3.8–4.8)
Alkaline Phosphatase: 81 IU/L (ref 39–117)
BUN/Creatinine Ratio: 24 (ref 10–24)
BUN: 24 mg/dL (ref 8–27)
Bilirubin Total: 0.4 mg/dL (ref 0.0–1.2)
CO2: 21 mmol/L (ref 20–29)
Calcium: 8.8 mg/dL (ref 8.6–10.2)
Chloride: 106 mmol/L (ref 96–106)
Creatinine, Ser: 1.02 mg/dL (ref 0.76–1.27)
GFR calc Af Amer: 88 mL/min/{1.73_m2} (ref >59)
GFR calc non Af Amer: 76 mL/min/{1.73_m2} (ref >59)
Globulin, Total: 2 g/dL (ref 1.5–4.5)
Glucose: 110 mg/dL — ABNORMAL HIGH (ref 65–99)
Potassium: 3.7 mmol/L (ref 3.5–5.2)
Sodium: 142 mmol/L (ref 134–144)
Total Protein: 6.5 g/dL (ref 6.0–8.5)

## 2019-11-26 LAB — LIPID PANEL WITH LDL/HDL RATIO
Cholesterol, Total: 115 mg/dL (ref 100–199)
HDL: 31 mg/dL — ABNORMAL LOW (ref >39)
LDL Chol Calc (NIH): 67 mg/dL (ref 0–99)
LDL/HDL Ratio: 2.2 ratio (ref 0.0–3.6)
Triglycerides: 89 mg/dL (ref 0–149)
VLDL Cholesterol Cal: 17 mg/dL (ref 5–40)

## 2019-11-26 LAB — CBC
Hematocrit: 46.5 % (ref 37.5–51.0)
Hemoglobin: 16.1 g/dL (ref 13.0–17.7)
MCH: 29.9 pg (ref 26.6–33.0)
MCHC: 34.6 g/dL (ref 31.5–35.7)
MCV: 86 fL (ref 79–97)
Platelets: 225 10*3/uL (ref 150–450)
RBC: 5.38 x10E6/uL (ref 4.14–5.80)
RDW: 13.2 % (ref 11.6–15.4)
WBC: 8.4 10*3/uL (ref 3.4–10.8)

## 2019-11-27 ENCOUNTER — Telehealth: Payer: Self-pay

## 2019-11-27 NOTE — Telephone Encounter (Signed)
-----   Message from Adrian Prows, MD sent at 11/26/2019  9:34 PM EDT ----- Normal labs. NOrmal cholesterol, blood sugar mildly elevated.   JG

## 2019-11-27 NOTE — Telephone Encounter (Signed)
Discussed patients lab results. He verbalized understanding

## 2019-11-27 NOTE — Telephone Encounter (Signed)
LMTCB results 

## 2019-12-12 ENCOUNTER — Other Ambulatory Visit: Payer: Self-pay | Admitting: Cardiology

## 2019-12-19 ENCOUNTER — Other Ambulatory Visit: Payer: Self-pay | Admitting: Cardiology

## 2020-02-14 ENCOUNTER — Other Ambulatory Visit: Payer: Self-pay | Admitting: Cardiology

## 2020-03-19 ENCOUNTER — Other Ambulatory Visit: Payer: Self-pay | Admitting: Cardiology

## 2020-05-08 ENCOUNTER — Other Ambulatory Visit: Payer: Self-pay | Admitting: Cardiology

## 2020-05-27 ENCOUNTER — Other Ambulatory Visit: Payer: Self-pay

## 2020-05-27 ENCOUNTER — Ambulatory Visit: Payer: Medicare HMO | Admitting: Cardiology

## 2020-05-27 ENCOUNTER — Encounter: Payer: Self-pay | Admitting: Cardiology

## 2020-05-27 VITALS — BP 138/90 | HR 77 | Resp 16 | Ht 73.0 in | Wt 266.0 lb

## 2020-05-27 DIAGNOSIS — I4821 Permanent atrial fibrillation: Secondary | ICD-10-CM

## 2020-05-27 DIAGNOSIS — G4733 Obstructive sleep apnea (adult) (pediatric): Secondary | ICD-10-CM

## 2020-05-27 DIAGNOSIS — I10 Essential (primary) hypertension: Secondary | ICD-10-CM

## 2020-05-27 MED ORDER — VALSARTAN-HYDROCHLOROTHIAZIDE 320-25 MG PO TABS: 1.0000 | ORAL_TABLET | Freq: Every day | ORAL | 3 refills | Status: DC

## 2020-05-27 NOTE — Progress Notes (Signed)
Primary Physician/Referring:  Patient, No Pcp Per  Patient ID: Michael Buck, male    DOB: 26-Jun-1951, 69 y.o.   MRN: [id number]  Chief Complaint  Patient presents with  . Follow-up    6 month  . Atrial Fibrillation  . Hyperlipidemia   HPI:    Michael Buck  is a 69 y.o. Caucasian male with permanent atrial fibrillation, used to present with marked fatigue with onset of atrial fibrillation, was on flecainide in the past, but was back in persistent atrial fibrillation but was asymptomatic. It was decided that rate control strategy was appropriate, also the left atrium was severely dilated by echocardiogram in 2018 and he has obesity and sleep apnea as risk factors.    The patient now presents for 27-month follow-up. Overall he is doing well. He remains asymptomatic. He is tolerating his medications well. He is compliant on CPAP. Denies any specific complaints.   Past Medical History:  Diagnosis Date  . Atrial fibrillation ([place])   . Hypertension   . Obesity   . Sleep apnea    Past Surgical History:  Procedure Laterality Date  . APPENDECTOMY    . CARDIOVERSION N/A 12/03/2012   Procedure: CARDIOVERSION;  Surgeon: Laverda Page, MD;  Location: [place];  Service: Cardiovascular;  Laterality: N/A;  . CARDIOVERSION N/A 04/08/2013   Procedure: CARDIOVERSION;  Surgeon: Laverda Page, MD;  Location: [facility] ENDOSCOPY;  Service: Cardiovascular;  Laterality: N/A;   Family History  Problem Relation Age of Onset  . Stroke Mother   . Hypertension Father   . Diabetes Father   . Heart attack Father   . Cancer Sister   . Colon cancer Neg Hx   . Esophageal cancer Neg Hx   . Stomach cancer Neg Hx   . Rectal cancer Neg Hx     Social History   Tobacco Use  . Smoking status: Former Smoker    Packs/day: 1.50    Years: 40.00    Pack years: 60.00    Types: Cigarettes    Quit date: 2001    Years since quitting: 20.7  . Smokeless tobacco: Never Used  Substance Use Topics  .  Alcohol use: Yes    Comment: seldom   ROS  Review of Systems  Constitutional: Negative for malaise/fatigue.  Cardiovascular: Negative for chest pain, dyspnea on exertion and leg swelling.  Gastrointestinal: Negative for melena.   Objective  Blood pressure 138/90, pulse 77, resp. rate 16, height $RemoveBe'6\' 1"'hzucLGcRk$  (1.854 m), weight 120.7 kg, SpO2 98 %.  Vitals with BMI 05/27/2020 05/27/2020 05/27/2020  Height - - $R'6\' 1"'Vk$   Weight - - 266 lbs  BMI - - 37.9  Systolic 024 097 353  Diastolic 90 93 299  Pulse - 77 86     Physical Exam HENT:     Head: Atraumatic.  Cardiovascular:     Rate and Rhythm: Rhythm irregularly irregular.     Pulses: Normal pulses and intact distal pulses.     Heart sounds: No murmur heard.  No gallop. No S3 or S4 sounds.      Comments: S1 is variable, S2 is normal. No JVD, No leg edema. Pulmonary:     Effort: Pulmonary effort is normal.     Breath sounds: Normal breath sounds.  Abdominal:     General: Bowel sounds are normal.     Palpations: Abdomen is soft.    Laboratory examination:   Recent Labs    11/25/19 0900  NA 142  K 3.7  CL 106  CO2 21  GLUCOSE 110*  BUN 24  CREATININE 1.02  CALCIUM 8.8  GFRNONAA 76  GFRAA 88   CrCl cannot be calculated (Patient's most recent lab result is older than the maximum 21 days allowed.).  CMP Latest Ref Rng & Units 11/25/2019 12/03/2012 10/22/2012  Glucose 65 - 99 mg/dL 110(H) 97 95  BUN 8 - 27 mg/dL 24 16 25(H)  Creatinine 0.76 - 1.27 mg/dL 1.02 0.74 0.91  Sodium 134 - 144 mmol/L 142 141 138  Potassium 3.5 - 5.2 mmol/L 3.7 4.1 4.5  Chloride 96 - 106 mmol/L 106 106 104  CO2 20 - 29 mmol/L $RemoveB'21 24 28  'ENUbkirG$ Calcium 8.6 - 10.2 mg/dL 8.8 8.9 9.2  Total Protein 6.0 - 8.5 g/dL 6.5 - 6.5  Total Bilirubin 0.0 - 1.2 mg/dL 0.4 - 0.6  Alkaline Phos 39 - 117 IU/L 81 - 53  AST 0 - 40 IU/L 19 - 12  ALT 0 - 44 IU/L 18 - 14   CBC Latest Ref Rng & Units 11/25/2019 10/22/2012 08/06/2007  WBC 3.4 - 10.8 x10E3/uL 8.4 8.2 11.5(H)  Hemoglobin  13.0 - 17.7 g/dL 16.1 15.7 15.3  Hematocrit 37.5 - 51.0 % 46.5 47.2 45.3  Platelets 150 - 450 x10E3/uL 225 - 266   Lipid Panel Recent Labs    11/25/19 0900  CHOL 115  TRIG 89  LDLCALC 67  HDL 31*    HEMOGLOBIN A1C Lab Results  Component Value Date   HGBA1C  08/06/2007    5.7 (NOTE)   The ADA recommends the following therapeutic goals for glycemic   control related to Hgb A1C measurement:   Goal of Therapy:   < 7.0% Hgb A1C   Action Suggested:  > 8.0% Hgb A1C   Ref:  Diabetes Care, 22, Suppl. 1, 1999   MPG 126 08/06/2007   TSH No results for input(s): TSH in the last 8760 hours.  External labs:   Cholesterol, total 98.000 M 11/08/2017 HDL 31.000 M 11/08/2017 LDL   Triglycerides 78.000 M 11/08/2017  Hemoglobin 14.700 G/ 11/08/2017; Platelets 227.000 X 11/08/2017  Creatinine, Serum 0.950 MG/ 11/08/2017 Potassium 4.000 MM 01/31/2016 ALT (SGPT) 24.000 IU/ 11/08/2017 TSH 5.290 11/08/2017  Medications and allergies  No Known Allergies   Current Outpatient Medications  Medication Instructions  . atorvastatin (LIPITOR) 20 MG tablet TAKE 1 TABLET EVERY DAY  . buPROPion (WELLBUTRIN SR) 150 MG 12 hr tablet TAKE 1 TABLET EVERY DAY  . diltiazem (CARDIZEM CD) 240 MG 24 hr capsule TAKE 1 CAPSULE EVERY DAY  . metoprolol succinate (TOPROL-XL) 100 MG 24 hr tablet TAKE 1/2 TABLET EVERY DAY  . valsartan-hydrochlorothiazide (DIOVAN-HCT) 320-25 MG tablet 1 tablet, Oral, Daily  . XARELTO 20 MG TABS tablet TAKE 1 TABLET EVERY DAY  WITH  SUPPER   Radiology:   No results found.  Cardiac Studies:   Echocardiogram 04/26/2017: Left ventricle cavity is normal in size. EF likely low normal Left atrial cavity is severely dilated. Structurally normal tricuspid valve with mild regurgitation. Pulmonary artery systolic pressure is normal.  Exercise myoview 11/08/12: 1.  EKG: Resting EKG showed A. Fibrillation, IRBBB, non specific ST and T changes. Stress EKG is negative for ischemia.  Patient  exercised on Bruce protocol for 3 minutes 1 sec. The maximum work level achieved was 4.9 MET's. Accelerated heart rate with low level stress test. The test was terminated due to achievement of the target heart rate.  2. Perfusion images reveal a small sized  inferior wall scar without significant superimposed ischemia. Dynamic gated images reveal mild decrease in Left ventricular ejection fraction which was estimated to be 49% 3. This represents a low risk study.  EKG  EKG 05/27/2020: Atrial fibrillation with controlled ventricular response at rate of 87 bpm, left axis deviation, left anterior fascicular block.  Poor R wave progression, cannot exclude anteroseptal infarct old.  Low-voltage complexes.  Pulmonary disease pattern. No significant change from EKG 11/20/2019   Assessment     ICD-10-CM   1. Permanent atrial fibrillation (HCC)  I48.21 EKG 12-Lead  2. Essential hypertension  I10 valsartan-hydrochlorothiazide (DIOVAN-HCT) 320-25 MG tablet  3. Obstructive sleep apnea  G47.33     CHA2DS2-VASc Score is 2.  Yearly risk of stroke: 2.3% (A, HTN).  Score of 1=0.6; 2=2.2; 3=3.2; 4=4.8; 5=7.2; 6=9.8; 7=>9.8) -(CHF; HTN; vasc disease DM,  Male = 1; Age <65 =0; 65-74 = 1,  >75 =2; stroke/embolism= 2).    Meds ordered this encounter  Medications  . valsartan-hydrochlorothiazide (DIOVAN-HCT) 320-25 MG tablet    Sig: Take 1 tablet by mouth daily.    Dispense:  90 tablet    Refill:  3    Medications Discontinued During This Encounter  Medication Reason  . sildenafil (VIAGRA) 100 MG tablet Patient Preference  . valsartan-hydrochlorothiazide (DIOVAN-HCT) 160-12.5 MG tablet Dose change    Recommendations:   Michael Buck  is a 69 y.o. Caucasian  male with permanent atrial fibrillation, previously used to present with marked fatigue with onset of atrial fibrillation, was on flecainide in the past, but was back in persistent atrial fibrillation but was asymptomatic. It was decided that rate  control strategy was appropriate, also the left atrium was severely dilated by echocardiogram in 2018 and he has obesity and sleep apnea as risk factors.   He presents today for 6 month follow up. I reviewed the patient's labs. His lipids are well controlled. Blood sugar was mildly elevated. Labs were otherwise normal.  His blood pressure was elevated in clinic today. I will increase his dose of valsartan HCT from 160-12.5 mg to 320-25 mg.   We also discussed weight loss, I would like him to lose at least 25 lbs. We had a discussion regarding healthy eating habits including moderation of sweets, sodium, red meats, junk foods as well as working on eating smaller portions. He is encouraged to eat plenty of leafy greens, vegetables, and lean proteins. I also encouraged regular physical activity with a goal of 30 minutes of exercise per day.   His atrial fibrillation is well controlled. He is tolerating Xarelto well without bleeding complications. I will see him back in 6 months for follow up.  Blair Heys, PA Student 05/27/20 4:05 PM  Patient seen and examined in conjunction with Blair Heys, PA second year student at [employer].  Time spent is in direct patient face to face encounter not including the teaching and training involved.    Adrian Prows, MD, [facility] 05/29/2020, 10:50 AM Office: [phone]

## 2020-07-26 ENCOUNTER — Other Ambulatory Visit: Payer: Self-pay | Admitting: Cardiology

## 2020-07-28 ENCOUNTER — Other Ambulatory Visit: Payer: Self-pay | Admitting: Cardiology

## 2020-07-28 DIAGNOSIS — I480 Paroxysmal atrial fibrillation: Secondary | ICD-10-CM

## 2020-08-06 ENCOUNTER — Other Ambulatory Visit: Payer: Self-pay | Admitting: Cardiology

## 2020-10-31 ENCOUNTER — Other Ambulatory Visit: Payer: Self-pay | Admitting: Cardiology

## 2020-11-25 ENCOUNTER — Ambulatory Visit: Payer: Medicare HMO | Admitting: Cardiology

## 2020-11-25 ENCOUNTER — Other Ambulatory Visit: Payer: Self-pay

## 2020-11-25 ENCOUNTER — Encounter: Payer: Self-pay | Admitting: Cardiology

## 2020-11-25 VITALS — BP 126/75 | HR 81 | Temp 98.2°F | Resp 17 | Ht 73.0 in | Wt 254.0 lb

## 2020-11-25 DIAGNOSIS — I4821 Permanent atrial fibrillation: Secondary | ICD-10-CM

## 2020-11-25 DIAGNOSIS — E7800 Pure hypercholesterolemia, unspecified: Secondary | ICD-10-CM

## 2020-11-25 DIAGNOSIS — I10 Essential (primary) hypertension: Secondary | ICD-10-CM

## 2020-11-25 DIAGNOSIS — G4733 Obstructive sleep apnea (adult) (pediatric): Secondary | ICD-10-CM

## 2020-11-25 NOTE — Progress Notes (Signed)
Primary Physician/Referring:  Patient, No Pcp Per  Patient ID: CHANNON AMBROSINI, male    DOB: 01-15-1951, 70 y.o.   MRN: [id number]  Chief Complaint  Patient presents with  . Follow-up    6 MONTH  . Atrial Fibrillation  . Hypertension   HPI:    COTTRELL GENTLES  is a 70 y.o. Caucasian male with permanent atrial fibrillation, used to present with marked fatigue with onset of atrial fibrillation, was on flecainide in the past, but was back in persistent atrial fibrillation but was asymptomatic. It was decided that rate control strategy was appropriate, also the left atrium was severely dilated by echocardiogram in 2018 and he has obesity and sleep apnea as risk factors.    The patient now presents for 62-month follow-up. Overall he is doing well. He remains asymptomatic. He is tolerating his medications well. He is compliant on CPAP. Denies any specific complaints.   Past Medical History:  Diagnosis Date  . Atrial fibrillation ([place])   . Hypertension   . Obesity   . Sleep apnea    Past Surgical History:  Procedure Laterality Date  . APPENDECTOMY    . CARDIOVERSION N/A 12/03/2012   Procedure: CARDIOVERSION;  Surgeon: Laverda Page, MD;  Location: [place];  Service: Cardiovascular;  Laterality: N/A;  . CARDIOVERSION N/A 04/08/2013   Procedure: CARDIOVERSION;  Surgeon: Laverda Page, MD;  Location: [facility] ENDOSCOPY;  Service: Cardiovascular;  Laterality: N/A;   Family History  Problem Relation Age of Onset  . Stroke Mother   . Hypertension Father   . Diabetes Father   . Heart attack Father   . Cancer Sister   . Colon cancer Neg Hx   . Esophageal cancer Neg Hx   . Stomach cancer Neg Hx   . Rectal cancer Neg Hx     Social History   Tobacco Use  . Smoking status: Former Smoker    Packs/day: 1.50    Years: 40.00    Pack years: 60.00    Types: Cigarettes    Quit date: 2001    Years since quitting: 21.2  . Smokeless tobacco: Never Used  Substance Use Topics  .  Alcohol use: Yes    Comment: seldom   ROS  Review of Systems  Constitutional: Negative for malaise/fatigue.  Cardiovascular: Negative for chest pain, dyspnea on exertion and leg swelling.  Musculoskeletal: Positive for joint pain.  Gastrointestinal: Negative for melena.   Objective  Blood pressure 126/75, pulse 81, temperature 98.2 F (36.8 C), temperature source Temporal, resp. rate 17, height $RemoveBe'6\' 1"'kVseWcXRc$  (1.854 m), weight 254 lb (115.2 kg), SpO2 98 %.  Vitals with BMI 11/25/2020 05/27/2020 05/27/2020  Height $Remov'6\' 1"'rjetSc$  - -  Weight 254 lbs - -  BMI 33.29 - -  Systolic 518 841 660  Diastolic 75 90 93  Pulse 81 - 77     Physical Exam Constitutional:      Appearance: He is obese.  HENT:     Head: Atraumatic.  Cardiovascular:     Rate and Rhythm: Normal rate. Rhythm irregular.     Pulses: Normal pulses and intact distal pulses.     Heart sounds: No murmur heard. No gallop. No S3 or S4 sounds.      Comments: S1 is variable, S2 is normal. No JVD, No leg edema. Pulmonary:     Effort: Pulmonary effort is normal.     Breath sounds: Normal breath sounds.  Abdominal:     General: Bowel sounds are  normal.     Palpations: Abdomen is soft.    Laboratory examination:   No results for input(s): NA, K, CL, CO2, GLUCOSE, BUN, CREATININE, CALCIUM, GFRNONAA, GFRAA in the last 8760 hours. CrCl cannot be calculated (Patient's most recent lab result is older than the maximum 21 days allowed.).  CMP Latest Ref Rng & Units 11/25/2019 12/03/2012 10/22/2012  Glucose 65 - 99 mg/dL 110(H) 97 95  BUN 8 - 27 mg/dL 24 16 25(H)  Creatinine 0.76 - 1.27 mg/dL 1.02 0.74 0.91  Sodium 134 - 144 mmol/L 142 141 138  Potassium 3.5 - 5.2 mmol/L 3.7 4.1 4.5  Chloride 96 - 106 mmol/L 106 106 104  CO2 20 - 29 mmol/L $RemoveB'21 24 28  'kYcirQRc$ Calcium 8.6 - 10.2 mg/dL 8.8 8.9 9.2  Total Protein 6.0 - 8.5 g/dL 6.5 - 6.5  Total Bilirubin 0.0 - 1.2 mg/dL 0.4 - 0.6  Alkaline Phos 39 - 117 IU/L 81 - 53  AST 0 - 40 IU/L 19 - 12  ALT 0 - 44  IU/L 18 - 14   CBC Latest Ref Rng & Units 11/25/2019 10/22/2012 08/06/2007  WBC 3.4 - 10.8 x10E3/uL 8.4 8.2 11.5(H)  Hemoglobin 13.0 - 17.7 g/dL 16.1 15.7 15.3  Hematocrit 37.5 - 51.0 % 46.5 47.2 45.3  Platelets 150 - 450 x10E3/uL 225 - 266   Lipid Panel No results for input(s): CHOL, TRIG, LDLCALC, VLDL, HDL, CHOLHDL, LDLDIRECT in the last 8760 hours.  HEMOGLOBIN A1C Lab Results  Component Value Date   HGBA1C  08/06/2007    5.7 (NOTE)   The ADA recommends the following therapeutic goals for glycemic   control related to Hgb A1C measurement:   Goal of Therapy:   < 7.0% Hgb A1C   Action Suggested:  > 8.0% Hgb A1C   Ref:  Diabetes Care, 22, Suppl. 1, 1999   MPG 126 08/06/2007   TSH No results for input(s): TSH in the last 8760 hours.  External labs:   Cholesterol, total 98.000 M 11/08/2017 HDL 31.000 M 11/08/2017 LDL   Triglycerides 78.000 M 11/08/2017  Hemoglobin 14.700 G/ 11/08/2017; Platelets 227.000 X 11/08/2017  Creatinine, Serum 0.950 MG/ 11/08/2017 Potassium 4.000 MM 01/31/2016 ALT (SGPT) 24.000 IU/ 11/08/2017 TSH 5.290 11/08/2017  Medications and allergies  No Known Allergies   Current Outpatient Medications  Medication Instructions  . atorvastatin (LIPITOR) 20 MG tablet TAKE 1 TABLET EVERY DAY  . buPROPion (WELLBUTRIN SR) 150 MG 12 hr tablet TAKE 1 TABLET EVERY DAY  . diltiazem (CARDIZEM CD) 240 MG 24 hr capsule TAKE 1 CAPSULE EVERY DAY  . metoprolol succinate (TOPROL-XL) 100 MG 24 hr tablet TAKE 1/2 TABLET EVERY DAY  . valsartan-hydrochlorothiazide (DIOVAN-HCT) 320-25 MG tablet 1 tablet, Oral, Daily  . XARELTO 20 MG TABS tablet TAKE 1 TABLET EVERY DAY  WITH  SUPPER   Radiology:   No results found.  Cardiac Studies:   Echocardiogram 04/26/2017: Left ventricle cavity is normal in size. EF likely low normal Left atrial cavity is severely dilated. Structurally normal tricuspid valve with mild regurgitation. Pulmonary artery systolic pressure is normal.  Exercise  myoview 11/08/12: 1.  EKG: Resting EKG showed A. Fibrillation, IRBBB, non specific ST and T changes. Stress EKG is negative for ischemia.  Patient exercised on Bruce protocol for 3 minutes 1 sec. The maximum work level achieved was 4.9 MET's. Accelerated heart rate with low level stress test. The test was terminated due to achievement of the target heart rate.  2. Perfusion images reveal a  small sized inferior wall scar without significant superimposed ischemia. Dynamic gated images reveal mild decrease in Left ventricular ejection fraction which was estimated to be 49% 3. This represents a low risk study.  EKG  EKG 05/27/2020: Atrial fibrillation with controlled ventricular response at rate of 87 bpm, left axis deviation, left anterior fascicular block.  Poor R wave progression, cannot exclude anteroseptal infarct old.  Low-voltage complexes.  Pulmonary disease pattern. No significant change from EKG 11/20/2019   Assessment     ICD-10-CM   1. Primary hypertension  I10 EKG 12-Lead    CHA2DS2-VASc Score is 2.  Yearly risk of stroke: 2.3% (A, HTN).  Score of 1=0.6; 2=2.2; 3=3.2; 4=4.8; 5=7.2; 6=9.8; 7=>9.8) -(CHF; HTN; vasc disease DM,  Male = 1; Age <65 =0; 65-74 = 1,  >75 =2; stroke/embolism= 2).    No orders of the defined types were placed in this encounter.   There are no discontinued medications.  Recommendations:   DENI LEFEVER  is a 70 y.o. Caucasian  male with permanent atrial fibrillation, previously used to present with marked fatigue with onset of atrial fibrillation, was on flecainide in the past, but was back in persistent atrial fibrillation but was asymptomatic. It was decided that rate control strategy was appropriate, also the left atrium was severely dilated by echocardiogram in 2018 and he has obesity and sleep apnea as risk factors.   He presents today for 6 month follow up. I reviewed the patient's labs. His lipids are well controlled. Blood sugar was mildly elevated.  Labs were otherwise normal.  His blood pressure was elevated in clinic today. I will increase his dose of valsartan HCT from 160-12.5 mg to 320-25 mg.   We also discussed weight loss, I would like him to lose at least 25 lbs. We had a discussion regarding healthy eating habits including moderation of sweets, sodium, red meats, junk foods as well as working on eating smaller portions. He is encouraged to eat plenty of leafy greens, vegetables, and lean proteins. I also encouraged regular physical activity with a goal of 30 minutes of exercise per day.   His atrial fibrillation is well controlled. He is tolerating Xarelto well without bleeding complications. I will see him back in 6 months for follow up.  Blair Heys, PA Student 11/25/20 4:37 PM  Patient seen and examined in conjunction with Blair Heys, PA second year student at [employer].  Time spent is in direct patient face to face encounter not including the teaching and training involved.    Adrian Prows, MD, [facility] 11/25/2020, 4:37 PM Office: [phone]

## 2020-11-25 NOTE — Progress Notes (Signed)
Primary Physician/Referring:  Patient, No Pcp Per  Patient ID: Michael Buck, male    DOB: [birth year], 70 y.o.   MRN: [id number]  Chief Complaint  Patient presents with  . Follow-up    6 MONTH  . Atrial Fibrillation  . Hypertension  . Hyperlipidemia   HPI:    Michael Buck  is a 70 y.o. Caucasian male with permanent atrial fibrillation, used to present with marked fatigue with onset of atrial fibrillation, was on flecainide in the past, but was back in persistent atrial fibrillation but was asymptomatic. It was decided that rate control strategy was appropriate, also the left atrium was severely dilated by echocardiogram in 2018 and he has obesity and sleep apnea as risk factors.    The patient now presents for 69-month follow-up. Overall he is doing well. He remains asymptomatic from cardiac standpoint, has lost about 8 pounds in weight.  He still having difficulty with use of CPAP as he does not have appropriate equipment and he has been unsuccessful in making an appointment with Dr. Baird Lyons.Marland Kitchen He is tolerating his medications well.   Past Medical History:  Diagnosis Date  . Atrial fibrillation ([place])   . Hypertension   . Obesity   . Sleep apnea    Past Surgical History:  Procedure Laterality Date  . APPENDECTOMY    . CARDIOVERSION N/A 12/03/2012   Procedure: CARDIOVERSION;  Surgeon: Laverda Page, MD;  Location: [place];  Service: Cardiovascular;  Laterality: N/A;  . CARDIOVERSION N/A 04/08/2013   Procedure: CARDIOVERSION;  Surgeon: Laverda Page, MD;  Location: [facility] ENDOSCOPY;  Service: Cardiovascular;  Laterality: N/A;   Family History  Problem Relation Age of Onset  . Stroke Mother   . Hypertension Father   . Diabetes Father   . Heart attack Father   . Cancer Sister   . Colon cancer Neg Hx   . Esophageal cancer Neg Hx   . Stomach cancer Neg Hx   . Rectal cancer Neg Hx     Social History   Tobacco Use  . Smoking status: Former Smoker     Packs/day: 1.50    Years: 40.00    Pack years: 60.00    Types: Cigarettes    Quit date: 2001    Years since quitting: 21.2  . Smokeless tobacco: Never Used  Substance Use Topics  . Alcohol use: Yes    Comment: seldom   ROS   Review of Systems  Cardiovascular: Negative for chest pain, dyspnea on exertion and leg swelling.  Respiratory: Positive for snoring (not using CPAP).   Musculoskeletal: Positive for arthritis and back pain.  Gastrointestinal: Negative for melena.   Objective  Blood pressure 126/75, pulse 81, temperature 98.2 F (36.8 C), temperature source Temporal, resp. rate 17, height $RemoveBe'6\' 1"'GZDAaTijt$  (1.854 m), weight 254 lb (115.2 kg), SpO2 98 %.  Vitals with BMI 11/25/2020 05/27/2020 05/27/2020  Height $Remov'6\' 1"'phFvPD$  - -  Weight 254 lbs - -  BMI 71.24 - -  Systolic 580 998 338  Diastolic 75 90 93  Pulse 81 - 77    Physical Exam Constitutional:      Appearance: He is obese.  HENT:     Head: Atraumatic.  Cardiovascular:     Rate and Rhythm: Normal rate. Rhythm irregular.     Pulses: Normal pulses and intact distal pulses.     Heart sounds: No murmur heard. No gallop. No S3 or S4 sounds.      Comments: S1  is variable, S2 is normal. No JVD, No leg edema. Pulmonary:     Effort: Pulmonary effort is normal.     Breath sounds: Normal breath sounds.  Abdominal:     General: Bowel sounds are normal.     Palpations: Abdomen is soft.    Laboratory examination:   No results for input(s): NA, K, CL, CO2, GLUCOSE, BUN, CREATININE, CALCIUM, GFRNONAA, GFRAA in the last 8760 hours. CrCl cannot be calculated (Patient's most recent lab result is older than the maximum 21 days allowed.).  CMP Latest Ref Rng & Units 11/25/2019 12/03/2012 10/22/2012  Glucose 65 - 99 mg/dL 110(H) 97 95  BUN 8 - 27 mg/dL 24 16 25(H)  Creatinine 0.76 - 1.27 mg/dL 1.02 0.74 0.91  Sodium 134 - 144 mmol/L 142 141 138  Potassium 3.5 - 5.2 mmol/L 3.7 4.1 4.5  Chloride 96 - 106 mmol/L 106 106 104  CO2 20 - 29 mmol/L $RemoveB'21  24 28  'SbLRoPxF$ Calcium 8.6 - 10.2 mg/dL 8.8 8.9 9.2  Total Protein 6.0 - 8.5 g/dL 6.5 - 6.5  Total Bilirubin 0.0 - 1.2 mg/dL 0.4 - 0.6  Alkaline Phos 39 - 117 IU/L 81 - 53  AST 0 - 40 IU/L 19 - 12  ALT 0 - 44 IU/L 18 - 14   CBC Latest Ref Rng & Units 11/25/2019 10/22/2012 08/06/2007  WBC 3.4 - 10.8 x10E3/uL 8.4 8.2 11.5(H)  Hemoglobin 13.0 - 17.7 g/dL 16.1 15.7 15.3  Hematocrit 37.5 - 51.0 % 46.5 47.2 45.3  Platelets 150 - 450 x10E3/uL 225 - 266   Lipid Panel No results for input(s): CHOL, TRIG, LDLCALC, VLDL, HDL, CHOLHDL, LDLDIRECT in the last 8760 hours.    External labs:    Cholesterol, total 98.000 M 11/08/2017 HDL 31.000 M 11/08/2017 LDL   Triglycerides 78.000 M 11/08/2017  Hemoglobin 14.700 G/ 11/08/2017; Platelets 227.000 X 11/08/2017  Creatinine, Serum 0.950 MG/ 11/08/2017 Potassium 4.000 MM 01/31/2016 ALT (SGPT) 24.000 IU/ 11/08/2017 TSH 5.290 11/08/2017  Medications and allergies  No Known Allergies   Current Outpatient Medications  Medication Instructions  . atorvastatin (LIPITOR) 20 MG tablet TAKE 1 TABLET EVERY DAY  . buPROPion (WELLBUTRIN SR) 150 MG 12 hr tablet TAKE 1 TABLET EVERY DAY  . diltiazem (CARDIZEM CD) 240 MG 24 hr capsule TAKE 1 CAPSULE EVERY DAY  . metoprolol succinate (TOPROL-XL) 100 MG 24 hr tablet TAKE 1/2 TABLET EVERY DAY  . valsartan-hydrochlorothiazide (DIOVAN-HCT) 320-25 MG tablet 1 tablet, Oral, Daily  . XARELTO 20 MG TABS tablet TAKE 1 TABLET EVERY DAY  WITH  SUPPER   Radiology:   No results found.  Cardiac Studies:   Echocardiogram 04/26/2017: Left ventricle cavity is normal in size. EF likely low normal Left atrial cavity is severely dilated. Structurally normal tricuspid valve with mild regurgitation. Pulmonary artery systolic pressure is normal.  Exercise myoview 11/08/12: 1.  EKG: Resting EKG showed A. Fibrillation, IRBBB, non specific ST and T changes. Stress EKG is negative for ischemia.  Patient exercised on Bruce protocol for 3  minutes 1 sec. The maximum work level achieved was 4.9 MET's. Accelerated heart rate with low level stress test. The test was terminated due to achievement of the target heart rate.  2. Perfusion images reveal a small sized inferior wall scar without significant superimposed ischemia. Dynamic gated images reveal mild decrease in Left ventricular ejection fraction which was estimated to be 49% 3. This represents a low risk study.  EKG  EKG 11/25/2020: Atrial fibrillation with controlled ventricular response  at the rate of 93 bpm, left axis deviation, left intrafascicular block.  Low-voltage complexes. No significant change from EKG 05/27/2020.  Assessment     ICD-10-CM   1. Primary hypertension  I10 EKG 12-Lead    CBC    CMP14+EGFR  2. Permanent atrial fibrillation (HCC)  I48.21   3. Obstructive sleep apnea  G47.33 Ambulatory referral to Sleep Studies  4. Hypercholesteremia  E78.00 Lipid Panel With LDL/HDL Ratio    CHA2DS2-VASc Score is 2.  Yearly risk of stroke: 2.3% (A, HTN).  Score of 1=0.6; 2=2.2; 3=3.2; 4=4.8; 5=7.2; 6=9.8; 7=>9.8) -(CHF; HTN; vasc disease DM,  Male = 1; Age <65 =0; 65-74 = 1,  >75 =2; stroke/embolism= 2).    No orders of the defined types were placed in this encounter.   There are no discontinued medications.  Recommendations:   AIMAN NOE  is a 70 y.o. Caucasian  male with permanent atrial fibrillation, previously used to present with marked fatigue with onset of atrial fibrillation, was on flecainide in the past, but was back in persistent atrial fibrillation but was asymptomatic. It was decided that rate control strategy was appropriate, also the left atrium was severely dilated by echocardiogram in 2018 and he has obesity and sleep apnea as risk factors.   He presents today for 6 month follow up.  Since increasing the dose of valsartan HCT his blood pressure is not well controlled.  Is also lost 8 pounds in weight.  Tolerating anticoagulation.  Lab orders  placed for routine labs and therapeutic drug monitoring including lipid profile testing.  He has been having difficulty with CPAP use over the past 6 months and he thinks that the machine is very old and he has been unsuccessful in making appointment with Dr. Baird Lyons, a formal referral was made by me today.  Advised him the importance of using CPAP.  He requests parking placard due to arthritis, 6 months significant signed off. OV in 1 year.    Adrian Prows, MD, [facility] 11/25/2020, 5:11 PM Office: [phone]

## 2020-12-10 LAB — CBC
Hematocrit: 46.2 % (ref 37.5–51.0)
Hemoglobin: 15.3 g/dL (ref 13.0–17.7)
MCH: 29.7 pg (ref 26.6–33.0)
MCHC: 33.1 g/dL (ref 31.5–35.7)
MCV: 90 fL (ref 79–97)
Platelets: 233 10*3/uL (ref 150–450)
RBC: 5.16 x10E6/uL (ref 4.14–5.80)
RDW: 13.1 % (ref 11.6–15.4)
WBC: 8.3 10*3/uL (ref 3.4–10.8)

## 2020-12-10 LAB — LIPID PANEL WITH LDL/HDL RATIO
Cholesterol, Total: 121 mg/dL (ref 100–199)
HDL: 33 mg/dL — ABNORMAL LOW (ref >39)
LDL Chol Calc (NIH): 73 mg/dL (ref 0–99)
LDL/HDL Ratio: 2.2 ratio (ref 0.0–3.6)
Triglycerides: 71 mg/dL (ref 0–149)
VLDL Cholesterol Cal: 15 mg/dL (ref 5–40)

## 2020-12-10 LAB — CMP14+EGFR
ALT: 19 IU/L (ref 0–44)
AST: 20 IU/L (ref 0–40)
Albumin/Globulin Ratio: 2 (ref 1.2–2.2)
Albumin: 4.5 g/dL (ref 3.8–4.8)
Alkaline Phosphatase: 71 IU/L (ref 44–121)
BUN/Creatinine Ratio: 21 (ref 10–24)
BUN: 22 mg/dL (ref 8–27)
Bilirubin Total: 0.7 mg/dL (ref 0.0–1.2)
CO2: 22 mmol/L (ref 20–29)
Calcium: 9.1 mg/dL (ref 8.6–10.2)
Chloride: 102 mmol/L (ref 96–106)
Creatinine, Ser: 1.05 mg/dL (ref 0.76–1.27)
Globulin, Total: 2.2 g/dL (ref 1.5–4.5)
Glucose: 97 mg/dL (ref 65–99)
Potassium: 4.4 mmol/L (ref 3.5–5.2)
Sodium: 142 mmol/L (ref 134–144)
Total Protein: 6.7 g/dL (ref 6.0–8.5)
eGFR: 77 mL/min/{1.73_m2} (ref >59)

## 2020-12-10 NOTE — Progress Notes (Signed)
Normal labs.

## 2020-12-10 NOTE — Progress Notes (Signed)
Called patient, NA, LMAM

## 2020-12-15 NOTE — Progress Notes (Signed)
Called and left lab results on [facility].

## 2021-01-31 ENCOUNTER — Other Ambulatory Visit: Payer: Self-pay | Admitting: Cardiology

## 2021-03-25 ENCOUNTER — Telehealth: Payer: Self-pay | Admitting: Internal Medicine

## 2021-03-25 NOTE — Telephone Encounter (Signed)
Patient was referred back to Dr. Annamaria Boots for OSA management by Dr. Einar Gip. His last appt was with Dr. Annamaria Boots on 02/02/16 for OSA.   Left message for him to call back.   Dr. Annamaria Boots, would you be ok with him restablishing with you again?

## 2021-03-27 NOTE — Telephone Encounter (Signed)
Sure- I can see him again

## 2021-04-07 NOTE — Telephone Encounter (Signed)
Pt has been scheduled to see CY on 06/2021. Nothing further needed at this time.

## 2021-05-07 ENCOUNTER — Other Ambulatory Visit: Payer: Self-pay | Admitting: Cardiology

## 2021-05-07 DIAGNOSIS — I480 Paroxysmal atrial fibrillation: Secondary | ICD-10-CM

## 2021-05-07 DIAGNOSIS — I10 Essential (primary) hypertension: Secondary | ICD-10-CM

## 2021-07-03 NOTE — Progress Notes (Signed)
HPI male former smoker followed for OSA, complicated by HTN, AFib, NPSG 12/13/12- severe obstructive sleep apnea/hypoxia syndrome, AHI 93 per hour with body weight 255 pounds. CPAP titrated to 16 for AHI 3.9 per hour.   ====================================================   02/02/2016-70 year old male former smoker followed for OSA, complicated by HTN, AFib, CPAP auto/APS FOLLOWS FOR: Last seen 2014; DME:[facility]. Pt has not had any downloads recently-will need to send order for New masks-unable to keep on his face. Will also need to place order for DL. Fullface mask has been uncomfortable and he pulls it off a lot at night. 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 False teeth, so he is not a candidate for an oral appliance.  07/04/21- 70 yoM former smoker (60 pk yrs) followed for OSA, complicated by HTN, AFib/ Xarelto, CPAP auto/APS/ [facility]  Last seen 2017, returns to re-establish at request of Dr Cathlyn Parsons- Body weight today-268 lbs Covid vax-none Flu vax-no -----Patient needs a new cpap machine and it is not working anymore CPAP machine is old and making "disturbing" motor noise.  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 Cardiology follows for A-fib.  He denies any daytime breathing problems.  ROS-see HPI Constitutional:   No-   weight loss, night sweats, fevers, chills, fatigue, lassitude. HEENT:   No-  headaches, difficulty swallowing, tooth/dental problems, sore throat,       No-  sneezing, itching, ear ache, nasal congestion, post nasal drip,  CV:  No-   chest pain, orthopnea, PND, swelling in lower extremities, anasarca,  dizziness, palpitations Resp: No-   shortness of breath with exertion or at rest.              No-   productive cough,  No non-productive cough,  No- coughing up of blood.              No-   change in color of mucus.  No- wheezing.   Skin: No-   rash or lesions. GI:  No-   heartburn, indigestion, abdominal pain, nausea, vomiting,  GU:  MS:  No-   joint pain or  swelling.   Neuro-     nothing unusual Psych:  No- change in mood or affect. No depression or anxiety.  No memory loss.  OBJ- Physical Exam General- Alert, Oriented, Affect-appropriate, Distress- none acute. Overweight Skin- rash-none, lesions- none, excoriation- none Lymphadenopathy- none Head- atraumatic            Eyes- Gross vision intact, PERRLA, conjunctivae and secretions clear            Ears- Hearing, canals-normal            Nose- Clear, no-Septal dev, mucus, polyps, erosion, perforation             Throat- Mallampati III , mucosa clear , drainage- none, tonsils- atrophic. Dentures Neck- flexible , trachea midline, no stridor , thyroid nl, carotid no bruit Chest - symmetrical excursion , unlabored           Heart/CV- +IRR/Fib, no murmur , no gallop  , no rub, nl s1 s2                           - JVD- none , edema- none, stasis changes- none, varices- none           Lung- clear to P&A, wheeze- none, cough- none , dullness-none, rub- none           Chest wall-  Abd-  Br/ Gen/ Rectal- Not done, not indicated Extrem- cyanosis- none, clubbing, none, atrophy- none, strength- nl Neuro- grossly intact to observation

## 2021-07-04 ENCOUNTER — Ambulatory Visit (INDEPENDENT_AMBULATORY_CARE_PROVIDER_SITE_OTHER): Payer: Medicare HMO | Admitting: Internal Medicine

## 2021-07-04 ENCOUNTER — Encounter: Payer: Self-pay | Admitting: Internal Medicine

## 2021-07-04 ENCOUNTER — Other Ambulatory Visit: Payer: Self-pay

## 2021-07-04 DIAGNOSIS — G4733 Obstructive sleep apnea (adult) (pediatric): Secondary | ICD-10-CM | POA: Diagnosis not present

## 2021-07-04 DIAGNOSIS — I480 Paroxysmal atrial fibrillation: Secondary | ICD-10-CM | POA: Diagnosis not present

## 2021-07-04 NOTE — Patient Instructions (Signed)
Order- DME [facility]- please service old CPAP machine for disturbing noise, and replace as soon as possible  auto 10-20, mask of choice, humidifier, supplies, AirView/ card  Mr Lamb- you can talk to [facility] about when they can look at your old machine and when they can replace it   Please call if we can help

## 2021-08-05 ENCOUNTER — Other Ambulatory Visit: Payer: Self-pay | Admitting: Cardiology

## 2021-10-27 ENCOUNTER — Encounter: Payer: Self-pay | Admitting: Internal Medicine

## 2021-10-27 NOTE — Assessment & Plan Note (Signed)
Benefits from CPAP but needs replacement machine. Plan-DME [facility] replace old CPAP machine auto 10-20

## 2021-10-27 NOTE — Assessment & Plan Note (Signed)
Cardiology continues to follow.  Rhythm is slightly irregular and probably represents A-fib on exam today although could be sinus rhythm with occasional extra beats.

## 2021-11-25 ENCOUNTER — Ambulatory Visit: Payer: Medicare HMO | Admitting: Cardiology

## 2021-11-30 NOTE — Progress Notes (Signed)
? ?Primary Physician/Referring:  Patient, No Pcp Per (Inactive) ? ?Patient ID: Michael Buck, male    DOB: 04/21/1951, 71 y.o.   MRN: [id number] ? ?Chief Complaint  ?Patient presents with  ? Follow-up  ?  1 year  ? Atrial Fibrillation  ? ?HPI:   ? ?Michael Buck  is a 71 y.o. Caucasian male with permanent atrial fibrillation, used to present with marked fatigue with onset of atrial fibrillation, was on flecainide in the past, but was back in persistent atrial fibrillation but was asymptomatic. It was decided that rate control strategy was appropriate, also the left atrium was severely dilated by echocardiogram in 2018 and he has obesity and sleep apnea as risk factors.   ? ?Patient presents for 1 year follow-up, although he was previously advised to follow-up in 6 months.  Last office visit increased valsartan/hydrochlorothiazide from 160/12.5 to 320/25, repeat BMP remained stable.  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  Denies chest pain, palpitations, dizziness, syncope, near syncope.  He is tolerating anticoagulation well bleeding diathesis. ? ?Patient follows with Dr. Annamaria Boots for management of obstructive sleep apnea, on CPAP. ? ?Unfortunately patient does not have a PCP at this time. ? ?Past Medical History:  ?Diagnosis Date  ? Atrial fibrillation ([place])   ? Hypertension   ? Obesity   ? Sleep apnea   ? ?Past Surgical History:  ?Procedure Laterality Date  ? APPENDECTOMY    ? CARDIOVERSION N/A 12/03/2012  ? Procedure: CARDIOVERSION;  Surgeon: Laverda Page, MD;  Location: [place];  Service: Cardiovascular;  Laterality: N/A;  ? CARDIOVERSION N/A 04/08/2013  ? Procedure: CARDIOVERSION;  Surgeon: Laverda Page, MD;  Location: [place];  Service: Cardiovascular;  Laterality: N/A;  ? ?Family History  ?Problem Relation Age of Onset  ? Stroke Mother   ? Hypertension Father   ? Diabetes Father   ? Heart attack Father   ? Cancer  Sister   ? Colon cancer Neg Hx   ? Esophageal cancer Neg Hx   ? Stomach cancer Neg Hx   ? Rectal cancer Neg Hx   ?  ?Social History  ? ?Tobacco Use  ? Smoking status: Former  ?  Packs/day: 1.50  ?  Years: 40.00  ?  Pack years: 60.00  ?  Types: Cigarettes  ?  Quit date: 2001  ?  Years since quitting: 22.2  ? Smokeless tobacco: Never  ?Substance Use Topics  ? Alcohol use: Yes  ?  Comment: seldom  ? ?ROS  ? ?Review of Systems  ?Cardiovascular:  Negative for chest pain, dyspnea on exertion, leg swelling, near-syncope, palpitations and syncope.  ?Respiratory:  Positive for snoring (not using CPAP).   ?Musculoskeletal:  Positive for arthritis and back pain.  ?Gastrointestinal:  Negative for melena.  ? ?Objective  ?Blood pressure 115/83, pulse 86, temperature 98 ?F (36.7 ?C), temperature source Temporal, resp. rate 17, height $RemoveBe'6\' 1"'kGOGEkTiy$  (1.854 m), weight 262 lb 12.8 oz (119.2 kg), SpO2 96 %.  ? ?  12/01/2021  ?  2:44 PM 07/04/2021  ?  2:55 PM 11/25/2020  ?  3:46 PM  ?Vitals with BMI  ?Height $Remove'6\' 1"'fwtdMVM$  $RemoveB'6\' 1"'wZuIeRHy$  $RemoveBe'6\' 1"'RvJGPcosj$   ?Weight 262 lbs 13 oz 268 lbs 254 lbs  ?BMI 34.68 35.37 33.52  ?Systolic 497 026 378  ?Diastolic 83 70 75  ?Pulse 86 65 81  ?  ?Physical Exam ?Vitals reviewed.  ?Constitutional:   ?   Appearance:  He is obese.  ?Cardiovascular:  ?   Rate and Rhythm: Normal rate. Rhythm irregular.  ?   Pulses: Normal pulses and intact distal pulses.  ?   Heart sounds: No murmur heard. ?  No gallop. No S3 or S4 sounds.  ?   Comments: S1 is variable, S2 is normal. ?No JVD ?Pulmonary:  ?   Effort: Pulmonary effort is normal.  ?   Breath sounds: Normal breath sounds.  ?Musculoskeletal:  ?   Right lower leg: No edema.  ?   Left lower leg: No edema.  ? ?Laboratory examination:  ? ?Recent Labs  ?  12/09/20 ?0927  ?NA 142  ?K 4.4  ?CL 102  ?CO2 22  ?GLUCOSE 97  ?BUN 22  ?CREATININE 1.05  ?CALCIUM 9.1  ? ?CrCl cannot be calculated (Patient's most recent lab result is older than the maximum 21 days allowed.).  ? ?  Latest Ref Rng & Units 12/09/2020  ?   9:27 AM 11/25/2019  ?  9:00 AM 12/03/2012  ?  9:34 AM  ?CMP  ?Glucose 65 - 99 mg/dL 97   110   97    ?BUN 8 - 27 mg/dL $Remove'22   24   16    'cfJxZFF$ ?Creatinine 0.76 - 1.27 mg/dL 1.05   1.02   0.74    ?Sodium 134 - 144 mmol/L 142   142   141    ?Potassium 3.5 - 5.2 mmol/L 4.4   3.7   4.1    ?Chloride 96 - 106 mmol/L 102   106   106    ?CO2 20 - 29 mmol/L $RemoveB'22   21   24    'ZzGaMyxk$ ?Calcium 8.6 - 10.2 mg/dL 9.1   8.8   8.9    ?Total Protein 6.0 - 8.5 g/dL 6.7   6.5     ?Total Bilirubin 0.0 - 1.2 mg/dL 0.7   0.4     ?Alkaline Phos 44 - 121 IU/L 71   81     ?AST 0 - 40 IU/L 20   19     ?ALT 0 - 44 IU/L 19   18     ? ? ?  Latest Ref Rng & Units 12/09/2020  ?  9:27 AM 11/25/2019  ?  9:00 AM 10/22/2012  ?  9:13 AM  ?CBC  ?WBC 3.4 - 10.8 x10E3/uL 8.3   8.4   8.2    ?Hemoglobin 13.0 - 17.7 g/dL 15.3   16.1   15.7    ?Hematocrit 37.5 - 51.0 % 46.2   46.5   47.2    ?Platelets 150 - 450 x10E3/uL 233   225     ? ?Lipid Panel ?Recent Labs  ?  12/09/20 ?0927  ?CHOL 121  ?TRIG 71  ?[place] 73  ?HDL 33*  ?  ? ? ?External labs:  ? ? ?Cholesterol, total 98.000 M 11/08/2017 ?HDL 31.000 M 11/08/2017 ?LDL   ?Triglycerides 78.000 M 11/08/2017 ? ?Hemoglobin 14.700 G/ 11/08/2017; Platelets 227.000 X 11/08/2017 ? ?Creatinine, Serum 0.950 MG/ 11/08/2017 ?Potassium 4.000 MM 01/31/2016 ?ALT (SGPT) 24.000 IU/ 11/08/2017 ?TSH 5.290 11/08/2017 ? ?Allergies  ?No Known Allergies  ? ? ?Medications Prior to Visit:  ? ?Outpatient Medications Prior to Visit  ?Medication Sig Dispense Refill  ? atorvastatin (LIPITOR) 20 MG tablet TAKE 1 TABLET EVERY DAY 90 tablet 2  ? buPROPion (WELLBUTRIN SR) 150 MG 12 hr tablet TAKE 1 TABLET EVERY DAY 90 tablet 3  ? diltiazem (CARDIZEM CD) 240 MG  24 hr capsule TAKE 1 CAPSULE EVERY DAY 90 capsule 3  ? metoprolol succinate (TOPROL-XL) 100 MG 24 hr tablet TAKE 1/2 TABLET EVERY DAY 45 tablet 3  ? valsartan-hydrochlorothiazide (DIOVAN-HCT) 320-25 MG tablet TAKE 1 TABLET EVERY DAY 90 tablet 3  ? XARELTO 20 MG TABS tablet TAKE 1 TABLET EVERY DAY  WITH  SUPPER 90  tablet 2  ? ?No facility-administered medications prior to visit.  ? ?Final Medications at End of Visit   ? ?Current Meds  ?Medication Sig  ? atorvastatin (LIPITOR) 20 MG tablet TAKE 1 TABLET EVERY DAY  ? buPROPion (WELLBUTRIN SR) 150 MG 12 hr tablet TAKE 1 TABLET EVERY DAY  ? diltiazem (CARDIZEM CD) 240 MG 24 hr capsule TAKE 1 CAPSULE EVERY DAY  ? metoprolol succinate (TOPROL-XL) 100 MG 24 hr tablet TAKE 1/2 TABLET EVERY DAY  ? valsartan-hydrochlorothiazide (DIOVAN-HCT) 320-25 MG tablet TAKE 1 TABLET EVERY DAY  ? XARELTO 20 MG TABS tablet TAKE 1 TABLET EVERY DAY  WITH  SUPPER  ? ?Radiology:  ? ?No results found. ? ?Cardiac Studies:  ? ?Echocardiogram 04/26/2017: ?Left ventricle cavity is normal in size. EF likely low normal ?Left atrial cavity is severely dilated. ?Structurally normal tricuspid valve with mild regurgitation. ?Pulmonary artery systolic pressure is normal. ? ?Exercise myoview 11/08/12: ?1.  EKG: Resting EKG showed A. Fibrillation, IRBBB, non specific ST and T changes. Stress EKG is negative for ischemia.  Patient exercised on Bruce protocol for 3 minutes 1 sec. The maximum work level achieved was 4.9 MET?s. Accelerated heart rate with low level stress test. The test was terminated due to achievement of the target heart rate.  ?2. Perfusion images reveal a small sized inferior wall scar without significant superimposed ischemia. Dynamic gated images reveal mild decrease in Left ventricular ejection fraction which was estimated to be 49% ?3. This represents a low risk study. ? ?EKG ?12/01/2021: Atrial fibrillation with controlled ventricular response at a rate of 60 bpm.  Left axis, left anterior fascicular block.  Low voltage complexes.  Compared EKG 11/25/2020, no significant change. ? ?Assessment  ? ?  ICD-10-CM   ?1. Permanent atrial fibrillation (HCC)  I48.21 EKG 12-Lead  ?  CBC  ?  COMPLETE METABOLIC PANEL WITH GFR  ?  TSH  ?  Lipid Panel With LDL/HDL Ratio  ?  PCV ECHOCARDIOGRAM COMPLETE  ?  ?2.  Primary hypertension  I10   ?  ?3. Hypercholesteremia  E78.00   ?  ?  ?CHA2DS2-VASc Score is 2.  Yearly risk of stroke: 2.3% (A, HTN).  Score of 1=0.6; 2=2.2; 3=3.2; 4=4.8; 5=7.2; 6=9.8; 7=>9.8) ?-(CHF; HTN;

## 2021-12-01 ENCOUNTER — Encounter: Payer: Self-pay | Admitting: Student

## 2021-12-01 ENCOUNTER — Ambulatory Visit: Payer: Medicare HMO | Admitting: Student

## 2021-12-01 ENCOUNTER — Other Ambulatory Visit: Payer: Self-pay

## 2021-12-01 VITALS — BP 115/83 | HR 86 | Temp 98.0°F | Resp 17 | Ht 73.0 in | Wt 262.8 lb

## 2021-12-01 DIAGNOSIS — I4821 Permanent atrial fibrillation: Secondary | ICD-10-CM

## 2021-12-01 DIAGNOSIS — E7800 Pure hypercholesterolemia, unspecified: Secondary | ICD-10-CM

## 2021-12-01 DIAGNOSIS — I10 Essential (primary) hypertension: Secondary | ICD-10-CM

## 2022-04-19 ENCOUNTER — Other Ambulatory Visit: Payer: Self-pay | Admitting: Cardiology

## 2022-04-19 DIAGNOSIS — I480 Paroxysmal atrial fibrillation: Secondary | ICD-10-CM

## 2022-05-04 ENCOUNTER — Ambulatory Visit: Payer: Medicare HMO

## 2022-05-04 DIAGNOSIS — I4821 Permanent atrial fibrillation: Secondary | ICD-10-CM

## 2022-06-01 ENCOUNTER — Ambulatory Visit: Payer: Medicare HMO | Admitting: Student

## 2022-06-05 ENCOUNTER — Ambulatory Visit: Payer: Medicare HMO | Admitting: Internal Medicine

## 2022-06-16 ENCOUNTER — Ambulatory Visit: Payer: Medicare HMO

## 2022-06-16 VITALS — BP 153/110 | HR 76 | Temp 97.9°F | Resp 16 | Ht 73.0 in | Wt 268.0 lb

## 2022-06-16 DIAGNOSIS — E7800 Pure hypercholesterolemia, unspecified: Secondary | ICD-10-CM

## 2022-06-16 DIAGNOSIS — I10 Essential (primary) hypertension: Secondary | ICD-10-CM

## 2022-06-16 DIAGNOSIS — I4821 Permanent atrial fibrillation: Secondary | ICD-10-CM

## 2022-06-16 DIAGNOSIS — G4733 Obstructive sleep apnea (adult) (pediatric): Secondary | ICD-10-CM

## 2022-06-16 NOTE — Progress Notes (Signed)
Primary Physician/Referring:  Patient, No Pcp Per  Patient ID: Michael Buck, male    DOB: July 27, 1951, 71 y.o.   MRN: [id number]  Chief Complaint  Patient presents with   Follow-up    Atrial fibrillation HTN   HPI:    Michael Buck  is a 71 y.o. Caucasian male with permanent atrial fibrillation, was on flecainide in the past, but was back in persistent atrial fibrillation and asymptomatic. It was decided that rate control strategy was appropriate, also the left atrium was severely dilated by echocardiogram in 2018 and he has obesity and sleep apnea as risk factors.    Patient presents for 6 month follow-up. 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 He does not have an exercise routine and does admit that his diet could be better. Denies chest pain, palpitations, dizziness, syncope, near syncope.  He is tolerating anticoagulation well bleeding diathesis.  Patient follows with Dr. Annamaria Boots for management of obstructive sleep apnea, on CPAP.  Past Medical History:  Diagnosis Date   Atrial fibrillation ([place])    Hypertension    Obesity    Sleep apnea    Past Surgical History:  Procedure Laterality Date   APPENDECTOMY     CARDIOVERSION N/A 12/03/2012   Procedure: CARDIOVERSION;  Surgeon: Laverda Page, MD;  Location: [place];  Service: Cardiovascular;  Laterality: N/A;   CARDIOVERSION N/A 04/08/2013   Procedure: CARDIOVERSION;  Surgeon: Laverda Page, MD;  [facility] END[facility]COPY;  Service: Cardiovascular;  Laterality: N/A;   Family History  Problem Relation Age of Onset   Stroke Mother    Hypertension Father    Diabetes Father    Heart attack Father    Cancer Sister    Colon cancer Neg Hx    Esophageal cancer Neg Hx    Stomach cancer Neg Hx    Rectal cancer Neg Hx     Social History   Tobacco Use   Smoking status: Former    Packs/day: 1.50    Years: 40.00    Total pack years: 60.00     Types: Cigarettes    Quit date: 2001    Years since quitting: 22.7   Smokeless tobacco: Never  Substance Use Topics   Alcohol use: Yes    Comment: seldom   ROS   Review of Systems  Cardiovascular:  Negative for chest pain, dyspnea on exertion, leg swelling, near-syncope, palpitations and syncope.  Respiratory:  Positive for snoring (not using CPAP).   Musculoskeletal:  Positive for arthritis and back pain.  Gastrointestinal:  Negative for melena.   Objective  Blood pressure (!) 153/110, pulse 76, temperature 97.9 F (36.6 C), temperature source Temporal, resp. rate 16, height $RemoveBe'6\' 1"'DkaXZKKSt$  (1.854 m), weight 268 lb (121.6 kg), SpO2 97 %.     06/16/2022   10:35 AM 12/01/2021    2:44 PM 07/04/2021    2:55 PM  Vitals with BMI  Height $Remov'6\' 1"'OKItmt$  $Remove'6\' 1"'dBGjpOx$  $RemoveB'6\' 1"'YKxGTxzW$   Weight 268 lbs 262 lbs 13 oz 268 lbs  BMI 35.37 20.35 59.74  Systolic 163 845 364  Diastolic 680 83 70  Pulse 76 86 65    Physical Exam Vitals reviewed.  Cardiovascular:     Rate and Rhythm: Normal rate. Rhythm irregular.     Pulses: Normal pulses and intact distal pulses.     Heart sounds: No murmur heard.    No gallop. No S3 or S4 sounds.  Comments: S1 is variable, S2 is normal. No JVD Pulmonary:     Effort: Pulmonary effort is normal.     Breath sounds: Normal breath sounds.  Musculoskeletal:     Right lower leg: No edema.     Left lower leg: No edema.    Laboratory examination:   No results for input(s): "NA", "K", "CL", "CO2", "GLUCOSE", "BUN", "CREATININE", "CALCIUM", "GFRNONAA", "GFRAA" in the last 8760 hours.  CrCl cannot be calculated (Patient's most recent lab result is older than the maximum 21 days allowed.).     Latest Ref Rng & Units 12/09/2020    9:27 AM 11/25/2019    9:00 AM 12/03/2012    9:34 AM  CMP  Glucose 65 - 99 mg/dL 97  110  97   BUN 8 - 27 mg/dL $Remove'22  24  16   'tUXpEiV$ Creatinine 0.76 - 1.27 mg/dL 1.05  1.02  0.74   Sodium 134 - 144 mmol/L 142  142  141   Potassium 3.5 - 5.2 mmol/L 4.4  3.7  4.1    Chloride 96 - 106 mmol/L 102  106  106   CO2 20 - 29 mmol/L $RemoveB'22  21  24   'OtpVouLh$ Calcium 8.6 - 10.2 mg/dL 9.1  8.8  8.9   Total Protein 6.0 - 8.5 g/dL 6.7  6.5    Total Bilirubin 0.0 - 1.2 mg/dL 0.7  0.4    Alkaline Phos 44 - 121 IU/L 71  81    AST 0 - 40 IU/L 20  19    ALT 0 - 44 IU/L 19  18        Latest Ref Rng & Units 12/09/2020    9:27 AM 11/25/2019    9:00 AM 10/22/2012    9:13 AM  CBC  WBC 3.4 - 10.8 x10E3/uL 8.3  8.4  8.2   Hemoglobin 13.0 - 17.7 g/dL 15.3  16.1  15.7   Hematocrit 37.5 - 51.0 % 46.2  46.5  47.2   Platelets 150 - 450 x10E3/uL 233  225     Lipid Panel No results for input(s): "CHOL", "TRIG", "LDLCALC", "VLDL", "HDL", "CHOLHDL", "LDLDIRECT" in the last 8760 hours.   External labs:  Cholesterol, total 98.000 M 11/08/2017 HDL 31.000 M 11/08/2017 LDL   Triglycerides 78.000 M 11/08/2017  Hemoglobin 14.700 G/ 11/08/2017; Platelets 227.000 X 11/08/2017  Creatinine, Serum 0.950 MG/ 11/08/2017 Potassium 4.000 MM 01/31/2016 ALT (SGPT) 24.000 IU/ 11/08/2017 TSH 5.290 11/08/2017  Allergies  No Known Allergies    Medications Prior to Visit:   Outpatient Medications Prior to Visit  Medication Sig Dispense Refill   atorvastatin (LIPITOR) 20 MG tablet TAKE 1 TABLET EVERY DAY 90 tablet 2   buPROPion (WELLBUTRIN SR) 150 MG 12 hr tablet TAKE 1 TABLET EVERY DAY 90 tablet 3   diltiazem (CARDIZEM CD) 240 MG 24 hr capsule TAKE 1 CAPSULE EVERY DAY 90 capsule 3   metoprolol succinate (TOPROL-XL) 100 MG 24 hr tablet TAKE 1/2 TABLET EVERY DAY 45 tablet 3   valsartan-hydrochlorothiazide (DIOVAN-HCT) 320-25 MG tablet TAKE 1 TABLET EVERY DAY 90 tablet 3   XARELTO 20 MG TABS tablet TAKE 1 TABLET EVERY DAY WITH SUPPER 90 tablet 2   No facility-administered medications prior to visit.   Final Medications at End of Visit    Current Meds  Medication Sig   atorvastatin (LIPITOR) 20 MG tablet TAKE 1 TABLET EVERY DAY   buPROPion (WELLBUTRIN SR) 150 MG 12 hr tablet TAKE 1 TABLET EVERY DAY  diltiazem (CARDIZEM CD) 240 MG 24 hr capsule TAKE 1 CAPSULE EVERY DAY   metoprolol succinate (TOPROL-XL) 100 MG 24 hr tablet TAKE 1/2 TABLET EVERY DAY   valsartan-hydrochlorothiazide (DIOVAN-HCT) 320-25 MG tablet TAKE 1 TABLET EVERY DAY   XARELTO 20 MG TABS tablet TAKE 1 TABLET EVERY DAY WITH SUPPER   Radiology:   No results found.  Cardiac Studies:   Echocardiogram 04/26/2017: Left ventricle cavity is normal in size. EF likely low normal Left atrial cavity is severely dilated. Structurally normal tricuspid valve with mild regurgitation. Pulmonary artery systolic pressure is normal.  Exercise myoview 11/08/12: 1.  EKG: Resting EKG showed A. Fibrillation, IRBBB, non specific ST and T changes. Stress EKG is negative for ischemia.  Patient exercised on Bruce protocol for 3 minutes 1 sec. The maximum work level achieved was 4.9 MET's. Accelerated heart rate with low level stress test. The test was terminated due to achievement of the target heart rate.  2. Perfusion images reveal a small sized inferior wall scar without significant superimposed ischemia. Dynamic gated images reveal mild decrease in Left ventricular ejection fraction which was estimated to be 49% 3. This represents a low risk study.  Echocardiogram 05/04/2022: Normal LV systolic function with visual EF 60-65%. Left ventricle cavity is normal in size. Mild concentric hypertrophy of the left ventricle. Normal global wall motion. Indeterminate diastolic filling pattern, elevated LAP. Calculated EF 62%. Left atrial cavity is moderate to severely dilated. Structurally normal tricuspid valve.  Mild tricuspid regurgitation. IVC is dilated with respiratory variation. no significant change compared to prior  EKG 06/16/2022: Atrial fibrillation with controlled response ventricular response 87 bpm.  Left axis deviation.  Left anterior fascicular block.  Low voltage complexes.  Compared to previous EKG on 12/01/2021, no significant  change.  Assessment     ICD-10-CM   1. Permanent atrial fibrillation (HCC)  I48.21 EKG 12-Lead    2. Primary hypertension  I10     3. Hypercholesteremia  E78.00     4. Obstructive sleep apnea  G47.33       CHA2DS2-VASc Score is 2.  Yearly risk of stroke: 2.3% (A, HTN).  Score of 1=0.6; 2=2.2; 3=3.2; 4=4.8; 5=7.2; 6=9.8; 7=>9.8) -(CHF; HTN; vasc disease DM,  Male = 1; Age <65 =0; 65-74 = 1,  >75 =2; stroke/embolism= 2).    No orders of the defined types were placed in this encounter.   There are no discontinued medications.  Recommendations:   Permanent atrial fibrillation (HCC) Remains in afib with controlled ventricular response Continue on ToprolXL $RemoveBef'100mg'xPVMVgPkth$  daily and Diltiazem $RemoveBefor'240mg'OAPfnonlWbqb$  daily for rate control He is tolerating Xarelto without bleeding issues EKG unchanged from previous  Primary hypertension Blood pressure was slight elevated at today's visit, manual recheck was 142/78 He does not monitor blood pressure at home and admits to poor diet and no exercise routine Continue on valsartan-HCTZ as ordered We discussed importance of exercise, dietary changes, and weight loss Encouraged low sodium diet less than $RemoveB'2000mg'mTGDDBIB$  daily and exercising at least 30 minutes 5 times a week Echo results reviewed with patient  Hypercholesteremia Continues on Atorvastatin $RemoveBeforeD'20mg'cgXGGnIsSqrlzN$  without myalgias He has not obtained lab work, will do this next week TSH, Lipid panel, CBC, and CMP ordered at previous visit and still active  Obstructive sleep apnea He is not complaint with CPAP, managed by Dr. Annamaria Boots  Follow-up in 6 months, sooner if needed.   Ernst Spell, AGNP-C 06/16/2022, 11:10 AM Office: [phone]

## 2022-06-23 LAB — TSH: TSH: 3.73 u[IU]/mL (ref 0.450–4.500)

## 2022-06-23 LAB — CBC
Hematocrit: 42.2 % (ref 37.5–51.0)
Hemoglobin: 14.5 g/dL (ref 13.0–17.7)
MCH: 30.5 pg (ref 26.6–33.0)
MCHC: 34.4 g/dL (ref 31.5–35.7)
MCV: 89 fL (ref 79–97)
Platelets: 202 10*3/uL (ref 150–450)
RBC: 4.76 x10E6/uL (ref 4.14–5.80)
RDW: 12.7 % (ref 11.6–15.4)
WBC: 8.2 10*3/uL (ref 3.4–10.8)

## 2022-06-23 LAB — LIPID PANEL WITH LDL/HDL RATIO
Cholesterol, Total: 110 mg/dL (ref 100–199)
HDL: 33 mg/dL — ABNORMAL LOW (ref >39)
LDL Chol Calc (NIH): 62 mg/dL (ref 0–99)
LDL/HDL Ratio: 1.9 ratio (ref 0.0–3.6)
Triglycerides: 69 mg/dL (ref 0–149)
VLDL Cholesterol Cal: 15 mg/dL (ref 5–40)

## 2022-07-06 ENCOUNTER — Ambulatory Visit: Payer: Medicare HMO | Admitting: Internal Medicine

## 2022-07-12 ENCOUNTER — Other Ambulatory Visit: Payer: Self-pay | Admitting: Cardiology

## 2022-07-12 DIAGNOSIS — I10 Essential (primary) hypertension: Secondary | ICD-10-CM

## 2022-07-14 ENCOUNTER — Other Ambulatory Visit: Payer: Self-pay | Admitting: Cardiology

## 2022-08-02 ENCOUNTER — Encounter: Payer: Self-pay | Admitting: Gastroenterology

## 2022-09-25 ENCOUNTER — Encounter: Payer: Self-pay | Admitting: Nurse Practitioner

## 2022-10-05 ENCOUNTER — Encounter: Payer: Self-pay | Admitting: *Deleted

## 2022-10-13 ENCOUNTER — Telehealth: Payer: Self-pay | Admitting: *Deleted

## 2022-10-13 ENCOUNTER — Ambulatory Visit: Payer: Medicare HMO | Admitting: Nurse Practitioner

## 2022-10-13 ENCOUNTER — Encounter: Payer: Self-pay | Admitting: Nurse Practitioner

## 2022-10-13 VITALS — BP 122/72 | HR 68 | Ht 73.0 in | Wt 271.8 lb

## 2022-10-13 DIAGNOSIS — Z86010 Personal history of colonic polyps: Secondary | ICD-10-CM

## 2022-10-13 DIAGNOSIS — I4891 Unspecified atrial fibrillation: Secondary | ICD-10-CM | POA: Diagnosis not present

## 2022-10-13 DIAGNOSIS — Z7901 Long term (current) use of anticoagulants: Secondary | ICD-10-CM | POA: Diagnosis not present

## 2022-10-13 MED ORDER — NA SULFATE-K SULFATE-MG SULF 17.5-3.13-1.6 GM/177ML PO SOLN: ORAL | 0 refills | Status: DC

## 2022-10-13 NOTE — Progress Notes (Signed)
Assessment    Patient profile:  Michael Buck is a 72 y.o. year old male , known to [name] with a past medical history of adenomatous colon polyps, HTN, OSA, AFib on Xarelto  See PMH / [place] for additional history  # 72 year old male with a history of colon polyps. Two adenomas ( < 10 mm) removed at time of last colonoscopy November 2018.  Based on polyp surveillance guidelines at that time, a 5-year follow-up was recommended.  We sent him a colonoscopy recall letter in November 2023.  # Afib / chronic Xarelto.    Plan:    Schedule for a colonoscopy. The risks and benefits of colonoscopy with possible polypectomy / biopsies were discussed and the patient agrees to proceed.  Hold Xarelto for 2 days before procedure - will instruct when and how to resume after procedure. Patient understands that there is a low but real risk of cardiovascular event such as heart attack, stroke, or embolism /  thrombosis while off blood thinner. The patient consents to proceed. Will communicate by phone or EMR with patient's prescribing provider to confirm that holding Xarelto is reasonable in this case.  HPI:    Chief Complaint: time for colonoscopy   Michael Buck has no GI complaints.  Specifically, he has not been having any abdominal pain, nausea/vomiting, bowel changes or blood in stool.   Michael Buck is on Xarelto for atrial fibrillation.  No recent chest pain.   Previous Labs / Imaging::    Latest Ref Rng & Units 06/22/2022    8:19 AM 12/09/2020    9:27 AM 11/25/2019    9:00 AM  CBC  WBC 3.4 - 10.8 x10E3/uL 8.2  8.3  8.4   Hemoglobin 13.0 - 17.7 g/dL 14.5  15.3  16.1   Hematocrit 37.5 - 51.0 % 42.2  46.2  46.5   Platelets 150 - 450 x10E3/uL 202  233  225      Previous GI Evaluation   Nov 2018 surveillance colonoscopy - One 9 mm transverse colon polyp was removed.  One 4 mm transverse polyp was removed. One 5 mm rectal polyp was removed.   Surgical [P], transverse, rectum, polyp (3) - TUBULAR  ADENOMA (TWO FRAGMENTS). - HYPERPLASTIC POLYP (ONE FRAGMENT). - NO HIGH GRADE DYSPLASIA OR MALIGNANCY.  Imaging:  PCV ECHOCARDIOGRAM COMPLETE Echocardiogram 05/04/2022: Normal LV systolic function with visual EF 60-65%. Left ventricle cavity  is normal in size. Mild concentric hypertrophy of the left ventricle.  Normal global wall motion. Indeterminate diastolic filling pattern,  elevated LAP. Calculated EF 62%. Left atrial cavity is moderate to severely dilated. Structurally normal tricuspid valve.  Mild tricuspid regurgitation. IVC is dilated with respiratory variation. no significant change compared to prior    Past Medical History:  Diagnosis Date   Atrial fibrillation ([place])    Hypertension    Obesity    Sleep apnea    Tubular adenoma of colon    Past Surgical History:  Procedure Laterality Date   APPENDECTOMY     CARDIOVERSION N/A 12/03/2012   Procedure: CARDIOVERSION;  Surgeon: Laverda Page, MD;  Location: [place];  Service: Cardiovascular;  Laterality: N/A;   CARDIOVERSION N/A 04/08/2013   Procedure: CARDIOVERSION;  Surgeon: Laverda Page, MD;  Location[facility] ENDOSCOPY;  Service: Cardiovascular;  Laterality: N/A;   Family History  Problem Relation Age of Onset   Stroke Mother    Hypertension Father    Diabetes Father    Heart attack Father  Cancer Sister    Colon cancer Neg Hx    Esophageal cancer Neg Hx    Stomach cancer Neg Hx    Rectal cancer Neg Hx    Social History   Tobacco Use   Smoking status: Former    Packs/day: 1.50    Years: 40.00    Total pack years: 60.00    Types: Cigarettes    Quit date: 2001    Years since quitting: 23.1   Smokeless tobacco: Never  Vaping Use   Vaping Use: Never used  Substance Use Topics   Alcohol use: Yes    Comment: seldom   Drug use: No   Current Outpatient Medications  Medication Sig Dispense Refill   atorvastatin (LIPITOR) 20 MG tablet TAKE 1 TABLET EVERY DAY 90 tablet 10   buPROPion  (WELLBUTRIN SR) 150 MG 12 hr tablet TAKE 1 TABLET EVERY DAY 90 tablet 3   diltiazem (CARDIZEM CD) 240 MG 24 hr capsule TAKE 1 CAPSULE EVERY DAY 90 capsule 3   metoprolol succinate (TOPROL-XL) 100 MG 24 hr tablet TAKE 1/2 TABLET EVERY DAY 45 tablet 3   valsartan-hydrochlorothiazide (DIOVAN-HCT) 320-25 MG tablet TAKE 1 TABLET EVERY DAY 90 tablet 10   XARELTO 20 MG TABS tablet TAKE 1 TABLET EVERY DAY WITH SUPPER 90 tablet 2   No current facility-administered medications for this visit.   No Known Allergies   Review of Systems: Positive for hearing problems and urine leakage.  All other systems reviewed and negative except where noted in HPI.   Wt Readings from Last 3 Encounters:  06/16/22 268 lb (121.6 kg)  12/01/21 262 lb 12.8 oz (119.2 kg)  07/04/21 268 lb (121.6 kg)    Physical Exam   BP 122/72   Pulse 68 Comment: irregular  Ht '6\' 1"'$  (1.854 m)   Wt 271 lb 12.8 oz (123.3 kg)   BMI 35.86 kg/m  Constitutional:  Pleasant, generally well appearing male in no acute distress. Psychiatric:  Normal mood and affect. Behavior is normal. EENT: Pupils normal.  Conjunctivae are normal. No scleral icterus. Neck supple.  Cardiovascular: Normal rate, regular rhythm.  Pulmonary/chest: Effort normal and breath sounds normal. No wheezing, rales or rhonchi. Abdominal: Soft, nondistended, nontender. Bowel sounds active throughout. There are no masses palpable. No hepatomegaly. Neurological: Alert and oriented to person place and time. Skin: Skin is warm and dry. No rashes noted.  Tye Savoy, NP  10/13/2022, 10:09 AM

## 2022-10-13 NOTE — Patient Instructions (Signed)
You have been scheduled for a colonoscopy. Please follow written instructions given to you at your visit today.  Please pick up your prep supplies at the pharmacy within the next 1-3 days. If you use inhalers (even only as needed), please bring them with you on the day of your procedure.  _______________________________________________________  If your blood pressure at your visit was 140/90 or greater, please contact your primary care physician to follow up on this.  _______________________________________________________  If you are age 16 or older, your body mass index should be between 23-30. Your Body mass index is 35.86 kg/m. If this is out of the aforementioned range listed, please consider follow up with your Primary Care Provider.  If you are age 71 or younger, your body mass index should be between 19-25. Your Body mass index is 35.86 kg/m. If this is out of the aformentioned range listed, please consider follow up with your Primary Care Provider.   ________________________________________________________  The [place] providers would like to encourage you to use [facility] to communicate with providers for non-urgent requests or questions.  Due to long hold times on the telephone, sending your provider a message by [facility] may be a faster and more efficient way to get a response.  Please allow 48 business hours for a response.  Please remember that this is for non-urgent requests.  _______________________________________________________  Due to recent changes in healthcare laws, you may see the results of your imaging and laboratory studies on MyChart before your provider has had a chance to review them.  We understand that in some cases there may be results that are confusing or concerning to you. Not all laboratory results come back in the same time frame and the provider may be waiting for multiple results in order to interpret others.  Please give [place] 48 hours in order for your  provider to thoroughly review all the results before contacting the office for clarification of your results.

## 2022-10-13 NOTE — Telephone Encounter (Signed)
Request for surgical clearance:     Endoscopy Procedure  What type of surgery is being performed?     colonoscopy  When is this surgery scheduled?     12/15/22  What type of clearance is required ?   Pharmacy  Are there any medications that need to be held prior to surgery and how long? Xarelto, 2 days  Practice name and name of physician performing surgery?      [facility]  What is your office phone and fax number?      Phone- [phone]  [phone]  Anesthesia type (None, local, MAC, general) ?       MAC

## 2022-10-14 NOTE — Telephone Encounter (Signed)
Patient is low risk from cardiac standpoint to undergo colonoscopy.  Patient is scheduled for colonoscopy on 12/15/2022.  Please hold Xarelto on 12/13/2022 and restart same day postprocedure if no biopsy otherwise in 3 to 5 days.   Adrian Prows, MD, [facility] 10/14/2022, 6:00 AM Office: [phone] Fax: [phone] Pager: [phone]

## 2022-10-16 NOTE — Telephone Encounter (Signed)
Left detailed voice mail for Rob to call us back to go over Xarelto information.

## 2022-10-17 NOTE — Telephone Encounter (Signed)
Patient is returning your call.  

## 2022-10-17 NOTE — Telephone Encounter (Signed)
Jammie informed to hold Xarelto on April 3rd and 4th and he verbalized understanding.

## 2022-12-06 ENCOUNTER — Encounter: Payer: Self-pay | Admitting: Gastroenterology

## 2022-12-15 ENCOUNTER — Ambulatory Visit (AMBULATORY_SURGERY_CENTER): Payer: Medicare HMO | Admitting: Gastroenterology

## 2022-12-15 ENCOUNTER — Encounter: Payer: Self-pay | Admitting: Gastroenterology

## 2022-12-15 VITALS — BP 113/80 | HR 84 | Temp 97.5°F | Resp 19 | Ht 73.0 in | Wt 271.0 lb

## 2022-12-15 DIAGNOSIS — Z09 Encounter for follow-up examination after completed treatment for conditions other than malignant neoplasm: Secondary | ICD-10-CM

## 2022-12-15 DIAGNOSIS — D122 Benign neoplasm of ascending colon: Secondary | ICD-10-CM

## 2022-12-15 DIAGNOSIS — Z86010 Personal history of colonic polyps: Secondary | ICD-10-CM

## 2022-12-15 DIAGNOSIS — D128 Benign neoplasm of rectum: Secondary | ICD-10-CM

## 2022-12-15 DIAGNOSIS — D123 Benign neoplasm of transverse colon: Secondary | ICD-10-CM

## 2022-12-15 DIAGNOSIS — D120 Benign neoplasm of cecum: Secondary | ICD-10-CM

## 2022-12-15 MED ORDER — SODIUM CHLORIDE 0.9 % IV SOLN: 500.0000 mL | INTRAVENOUS | Status: DC

## 2022-12-15 NOTE — Op Note (Signed)
[facility] Patient Name: Michael Buck Procedure Date: 12/15/2022 9:23 AM MRN: [id number] Endoscopist: Meryl Dare , MD, [phone] Age: 72 Referring MD:  Date of Birth: [birth year] Gender: Male Account #: [id number] Procedure:                Colonoscopy Indications:              Surveillance: Personal history of adenomatous                            polyps on last colonoscopy > 5 years ago Medicines:                Monitored Anesthesia Care Procedure:                Pre-Anesthesia Assessment:                           - Prior to the procedure, a History and Physical                            was performed, and patient medications and                            allergies were reviewed. The patient's tolerance of                            previous anesthesia was also reviewed. The risks                            and benefits of the procedure and the sedation                            options and risks were discussed with the patient.                            All questions were answered, and informed consent                            was obtained. Prior Anticoagulants: The patient has                            taken Xarelto (rivaroxaban), last dose was 2 days                            prior to procedure. ASA Grade Assessment: II - A                            patient with mild systemic disease. After reviewing                            the risks and benefits, the patient was deemed in                            satisfactory condition to undergo the procedure.  After obtaining informed consent, the colonoscope                            was passed under direct vision. Throughout the                            procedure, the patient's blood pressure, pulse, and                            oxygen saturations were monitored continuously. The                            Olympus [id number] SN [id number] was introduced through                             the anus and advanced to the the cecum, identified                            by appendiceal orifice and ileocecal valve. The                            ileocecal valve, appendiceal orifice, and rectum                            were photographed. The quality of the bowel                            preparation was good. The colonoscopy was performed                            without difficulty. The patient tolerated the                            procedure well. Scope In: 9:35:40 AM Scope Out: 9:57:56 AM Scope Withdrawal Time: 0 hours 20 minutes 13 seconds  Total Procedure Duration: 0 hours 22 minutes 16 seconds  Findings:                 The perianal and digital rectal examinations were                            normal.                           Eight sessile polyps were found in the rectum (2),                            transverse colon (3), ascending colon (1), cecum                            (1) and ileocecal valve (1). The polyps were 5 to 7                            mm in size. These polyps were removed with a cold  snare. Resection and retrieval were complete.                           A few small-mouthed diverticula were found in the                            sigmoid colon.                           The exam was otherwise without abnormality on                            direct and retroflexion views. Complications:            No immediate complications. Estimated blood loss:                            None. Estimated Blood Loss:     Estimated blood loss: none. Impression:               - Eight 5 to 7 mm polyps in the rectum, in the                            transverse colon, in the ascending colon, in the                            cecum and at the ileocecal valve, removed with a                            cold snare. Resected and retrieved.                           - Mild diverticulosis in the sigmoid colon.                           - The  examination was otherwise normal on direct                            and retroflexion views. Recommendation:           - Repeat colonoscopy, likely 3 years, after studies                            are complete for surveillance based on pathology                            results.                           - Resume Xarelto (rivaroxaban) in 2 days at prior                            dose. Refer to managing physician for further                            adjustment of therapy.                           -  Patient has a contact number available for                            emergencies. The signs and symptoms of potential                            delayed complications were discussed with the                            patient. Return to normal activities tomorrow.                            Written discharge instructions were provided to the                            patient.                           - Resume previous diet.                           - Continue present medications.                           - Await pathology results. Meryl DareMalcolm T Arienne Gartin, MD 12/15/2022 10:02:31 AM This report has been signed electronically.

## 2022-12-15 NOTE — Progress Notes (Signed)
A and O x3. Report to RN. Tolerated MAC anesthesia well. 

## 2022-12-15 NOTE — Patient Instructions (Signed)
-  Restart Xarelto (rivaroxanan) in 2 days at prior dose   -Handout on polyps and dierticulosis provided -await pathology results -repeat colonoscopy for surveillance recommended. Date to be determined when pathology result become available    YOU HAD AN ENDOSCOPIC PROCEDURE TODAY AT THE [facility]:   Refer to the procedure report that was given to you for any specific questions about what was found during the examination.  If the procedure report does not answer your questions, please call your gastroenterologist to clarify.  If you requested that your care partner not be given the details of your procedure findings, then the procedure report has been included in a sealed envelope for you to review at your convenience later.  YOU SHOULD EXPECT: Some feelings of bloating in the abdomen. Passage of more gas than usual.  Walking can help get rid of the air that was put into your GI tract during the procedure and reduce the bloating. If you had a lower endoscopy (such as a colonoscopy or flexible sigmoidoscopy) you may notice spotting of blood in your stool or on the toilet paper. If you underwent a bowel prep for your procedure, you may not have a normal bowel movement for a few days.  Please Note:  You might notice some irritation and congestion in your nose or some drainage.  This is from the oxygen used during your procedure.  There is no need for concern and it should clear up in a day or so.  SYMPTOMS TO REPORT IMMEDIATELY:  Following lower endoscopy (colonoscopy or flexible sigmoidoscopy):  Excessive amounts of blood in the stool  Significant tenderness or worsening of abdominal pains  Swelling of the abdomen that is new, acute  Fever of 100F or higher  For urgent or emergent issues, a gastroenterologist can be reached at any hour by calling [phone]. Do not use MyChart messaging for urgent concerns.    DIET:  We do recommend a small meal at first, but then you may  proceed to your regular diet.  Drink plenty of fluids but you should avoid alcoholic beverages for 24 hours.  ACTIVITY:  You should plan to take it easy for the rest of today and you should NOT DRIVE or use heavy machinery until tomorrow (because of the sedation medicines used during the test).    FOLLOW UP: Our staff will call the number listed on your records the next business day following your procedure.  We will call around 7:15- 8:00 am to check on you and address any questions or concerns that you may have regarding the information given to you following your procedure. If we do not reach you, we will leave a message.     If any biopsies were taken you will be contacted by phone or by letter within the next 1-3 weeks.  Please call us at [phone] if you have not heard about the biopsies in 3 weeks.    SIGNATURES/CONFIDENTIALITY: You and/or your care partner have signed paperwork which will be entered into your electronic medical record.  These signatures attest to the fact that that the information above on your After Visit Summary has been reviewed and is understood.  Full responsibility of the confidentiality of this discharge information lies with you and/or your care-partner.

## 2022-12-15 NOTE — Progress Notes (Signed)
Called to room to assist during endoscopic procedure.  Patient ID and intended procedure confirmed with present staff. Received instructions for my participation in the procedure from the performing physician.  

## 2022-12-15 NOTE — Progress Notes (Signed)
History & Physical  Primary Care Physician:  Patient, No Pcp Per Primary Gastroenterologist: Claudette HeadMalcolm Bonnee Zertuche, MD  Impression / Plan:  Personal history of adenomatous colon polyps for surveillance colonoscopy.  Xarelto held prior to procedure.  CHIEF COMPLAINT:  Personal history of colon polyps   HPI: Michael Buck is a 72 y.o. male with a personal history of adenomatous colon polyps for surveillance colonoscopy.  Xarelto held prior to procedure.   Past Medical History:  Diagnosis Date   Atrial fibrillation    Hyperlipidemia    Hypertension    Obesity    Sleep apnea    wears CPAP   Tubular adenoma of colon     Past Surgical History:  Procedure Laterality Date   APPENDECTOMY     CARDIOVERSION N/A 12/03/2012   Procedure: CARDIOVERSION;  Surgeon: Pamella PertJagadeesh R Ganji, MD;  [facility];  Service: Cardiovascular;  Laterality: N/A;   CARDIOVERSION N/A 04/08/2013   Procedure: CARDIOVERSION;  Surgeon: Pamella PertJagadeesh R Ganji, MD;  [facility];  Service: Cardiovascular;  Laterality: N/A;   COLONOSCOPY      Prior to Admission medications   Medication Sig Start Date End Date Taking? Authorizing Provider  atorvastatin (LIPITOR) 20 MG tablet TAKE 1 TABLET EVERY DAY 07/14/22  Yes Yates DecampGanji, Jay, MD  buPROPion [facility](WELLBUTRIN SR) 150 MG 12 hr tablet TAKE 1 TABLET EVERY DAY 04/19/22  Yes Yates DecampGanji, Jay, MD  diltiazem (CARDIZEM CD) 240 MG 24 hr capsule TAKE 1 CAPSULE EVERY DAY 04/19/22  Yes Yates DecampGanji, Jay, MD  metoprolol succinate (TOPROL-XL) 100 MG 24 hr tablet TAKE 1/2 TABLET EVERY DAY 04/19/22  Yes Yates DecampGanji, Jay, MD  valsartan-hydrochlorothiazide (DIOVAN-HCT) 320-25 MG tablet TAKE 1 TABLET EVERY DAY 07/12/22  Yes Yates DecampGanji, Jay, MD  XARELTO 20 MG TABS tablet TAKE 1 TABLET EVERY DAY WITH SUPPER 04/19/22   Yates DecampGanji, Jay, MD    Current Outpatient Medications  Medication Sig Dispense Refill   atorvastatin (LIPITOR) 20 MG tablet TAKE 1 TABLET EVERY DAY 90 tablet 10   buPROPion (WELLBUTRIN SR) 150 MG 12 hr  tablet TAKE 1 TABLET EVERY DAY 90 tablet 3   diltiazem (CARDIZEM CD) 240 MG 24 hr capsule TAKE 1 CAPSULE EVERY DAY 90 capsule 3   metoprolol succinate (TOPROL-XL) 100 MG 24 hr tablet TAKE 1/2 TABLET EVERY DAY 45 tablet 3   valsartan-hydrochlorothiazide (DIOVAN-HCT) 320-25 MG tablet TAKE 1 TABLET EVERY DAY 90 tablet 10   XARELTO 20 MG TABS tablet TAKE 1 TABLET EVERY DAY WITH SUPPER 90 tablet 2   Current Facility-Administered Medications  Medication Dose Route Frequency Provider Last Rate Last Admin   0.9 %  sodium chloride infusion  500 mL Intravenous Continuous Meryl DareStark, Jilian West T, MD        Allergies as of 12/15/2022   (No Known Allergies)    Family History  Problem Relation Age of Onset   Stroke Mother    Hypertension Father    Diabetes Father    Heart attack Father    Cancer Sister    Colon cancer Neg Hx    Esophageal cancer Neg Hx    Stomach cancer Neg Hx    Rectal cancer Neg Hx     Social History   Socioeconomic History   Marital status: Married    Spouse name: Not on file   Number of children: 1   Years of education: Not on file   Highest education level: Not on file  Occupational History   Occupation: Personnel officerlectrician    Comment: Engineer, [employer]  Tobacco Use   Smoking status: Former    Packs/day: 1.50    Years: 40.00    Additional pack years: 0.00    Total pack years: 60.00    Types: Cigarettes    Quit date: 2001    Years since quitting: 23.2   Smokeless tobacco: Never  Vaping Use   Vaping Use: Never used  Substance and Sexual Activity   Alcohol use: Yes    Comment: very very rare   Drug use: No   Sexual activity: Not on file  Other Topics Concern   Not on file  Social History Narrative   Daily caffeine    Social Determinants of Health   Financial Resource Strain: Not on file  Food Insecurity: Not on file  Transportation Needs: Not on file  Physical Activity: Not on file  Stress: Not on file  Social Connections: Not on file  Intimate Partner  Violence: Not on file    Review of Systems:  All systems reviewed were negative except where noted in HPI.   Physical Exam: General:  Alert, well-developed, in NAD Head:  Normocephalic and atraumatic. Eyes:  Sclera clear, no icterus.   Conjunctiva pink. Ears:  Normal auditory acuity. Mouth:  No deformity or lesions.  Neck:  Supple; no masses. Lungs:  Clear throughout to auscultation.   No wheezes, crackles, or rhonchi.  Heart:  Regular rate and rhythm; no murmurs. Abdomen:  Soft, nondistended, nontender. No masses, hepatomegaly. No palpable masses.  Normal bowel sounds.    Rectal:  Deferred   Msk:  Symmetrical without gross deformities. Extremities:  Without edema. Neurologic:  Alert and  oriented x 4; grossly normal neurologically. Skin:  Intact without significant lesions or rashes. Psych:  Alert and cooperative. Normal mood and affect.  Venita Lick. Russella Dar  12/15/2022, 9:28 AM See Loretha Stapler, [facility], to contact our on call provider

## 2022-12-18 ENCOUNTER — Telehealth: Payer: Self-pay

## 2022-12-18 NOTE — Telephone Encounter (Signed)
  Follow up Call-     12/15/2022    8:34 AM  Call back number  Post procedure Call Back phone  # [phone]  Permission to leave phone message Yes     Patient questions:  Do you have a fever, pain , or abdominal swelling? No. Pain Score  0 *  Have you tolerated food without any problems? Yes.    Have you been able to return to your normal activities? Yes.    Do you have any questions about your discharge instructions: Diet   No. Medications  No. Follow up visit  No.  Do you have questions or concerns about your Care? No.  Actions: * If pain score is 4 or above: No action needed, pain <4.

## 2022-12-21 ENCOUNTER — Ambulatory Visit: Payer: Medicare HMO | Admitting: Cardiology

## 2022-12-22 ENCOUNTER — Ambulatory Visit: Payer: Medicare HMO | Admitting: Cardiology

## 2022-12-28 ENCOUNTER — Ambulatory Visit: Payer: Medicare HMO | Admitting: Cardiology

## 2022-12-28 ENCOUNTER — Encounter: Payer: Self-pay | Admitting: Cardiology

## 2022-12-28 ENCOUNTER — Encounter: Payer: Self-pay | Admitting: Gastroenterology

## 2022-12-28 VITALS — BP 110/71 | HR 109 | Resp 18 | Ht 73.0 in | Wt 259.2 lb

## 2022-12-28 DIAGNOSIS — I4821 Permanent atrial fibrillation: Secondary | ICD-10-CM

## 2022-12-28 DIAGNOSIS — G4733 Obstructive sleep apnea (adult) (pediatric): Secondary | ICD-10-CM

## 2022-12-28 DIAGNOSIS — I10 Essential (primary) hypertension: Secondary | ICD-10-CM

## 2022-12-28 DIAGNOSIS — E7800 Pure hypercholesterolemia, unspecified: Secondary | ICD-10-CM

## 2022-12-28 NOTE — Progress Notes (Signed)
Primary Physician/Referring:  Patient, No Pcp Per  Patient ID: Michael Buck, male    DOB: March 31, 1951, 72 y.o.   MRN: [id number]  Chief Complaint  Patient presents with   Atrial Fibrillation   Hyperlipidemia   Hypertension   Follow-up    6 months   HPI:    Michael Buck  is a 72 y.o. Caucasian male with permanent atrial fibrillation, hypercholesterolemia, moderate obesity and severe sleep apnea, colonic polyps presents for 71-month office visit.  Patient presents for 6 month follow-up. 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 Denies chest pain, palpitations, dizziness, syncope, near syncope.  He is tolerating anticoagulation well bleeding diathesis.  Patient follows with Dr. Maple Hudson for management of obstructive sleep apnea, on CPAP, but states that he has stopped using CPAP and would like to have a repeat sleep study and also would wish to have a new sleep machine as his had difficulty with old machine.  Past Medical History:  Diagnosis Date   Atrial fibrillation    Hyperlipidemia    Hypertension    Obesity    Sleep apnea    wears CPAP   Tubular adenoma of colon    Past Surgical History:  Procedure Laterality Date   APPENDECTOMY     CARDIOVERSION N/A 12/03/2012   Procedure: CARDIOVERSION;  Surgeon: Pamella Pert, MD;  Location: [facility] ENDOSCOPY;  Service: Cardiovascular;  Laterality: N/A;   CARDIOVERSION N/A 04/08/2013   Procedure: CARDIOVERSION;  Surgeon: Pamella Pert, MD;  [facility] END[facility]COPY;  Service: Cardiovascular;  Laterality: N/A;   COLONOSCOPY     Family History  Problem Relation Age of Onset   Stroke Mother    Hypertension Father    Diabetes Father    Heart attack Father    Cancer Sister    Colon cancer Neg Hx    Esophageal cancer Neg Hx    Stomach cancer Neg Hx    Rectal cancer Neg Hx     Social History   Tobacco Use   Smoking status: Former    Packs/day: 1.50     Years: 40.00    Additional pack years: 0.00    Total pack years: 60.00    Types: Cigarettes    Quit date: 2001    Years since quitting: 23.3   Smokeless tobacco: Never  Substance Use Topics   Alcohol use: Yes    Comment: very very rare   ROS   Review of Systems  Cardiovascular:  Negative for chest pain, dyspnea on exertion, leg swelling, palpitations and syncope.  Respiratory:  Positive for snoring (not using CPAP).   Gastrointestinal:  Negative for melena.   Objective  Blood pressure 110/71, pulse (!) 109, resp. rate 18, height  (1.854 m), weight 259 lb 3.2 oz (117.6 kg), SpO2 97 %.     12/28/2022    3:36 PM 12/15/2022   10:19 AM 12/15/2022   10:09 AM  Vitals with BMI  Height     Weight 259 lbs 3 oz    BMI 34.2    Systolic 110 113 409  Diastolic 71 80 74  Pulse 109 84 95    Physical Exam Vitals reviewed.  Cardiovascular:     Rate and Rhythm: Normal rate. Rhythm irregular.     Pulses: Normal pulses and intact distal pulses.     Heart sounds: No murmur heard.    No gallop. No S3 or S4 sounds.  Comments: S1 is variable, S2 is normal. No JVD Pulmonary:     Effort: Pulmonary effort is normal.     Breath sounds: Normal breath sounds.  Abdominal:     General: Bowel sounds are normal.     Palpations: Abdomen is soft.  Musculoskeletal:     Right lower leg: No edema.     Left lower leg: No edema.    Laboratory examination:   Lab Results  Component Value Date   NA 142 12/09/2020   K 4.4 12/09/2020   CO2 22 12/09/2020   GLUCOSE 97 12/09/2020   BUN 22 12/09/2020   CREATININE 1.05 12/09/2020   CALCIUM 9.1 12/09/2020   EGFR 77 12/09/2020   GFRNONAA 76 11/25/2019       Latest Ref Rng & Units 12/09/2020    9:27 AM 11/25/2019    9:00 AM 12/03/2012    9:34 AM  CMP  Glucose 65 - 99 mg/dL 97  409  97   BUN 8 - 27 mg/dL 22  24  16    Creatinine 0.76 - 1.27 mg/dL 8.11  9.14  7.82   Sodium 134 - 144 mmol/L 142  142  141   Potassium 3.5 - 5.2 mmol/L 4.4  3.7   4.1   Chloride 96 - 106 mmol/L 102  106  106   CO2 20 - 29 mmol/L 22  21  24    Calcium 8.6 - 10.2 mg/dL 9.1  8.8  8.9   Total Protein 6.0 - 8.5 g/dL 6.7  6.5    Total Bilirubin 0.0 - 1.2 mg/dL 0.7  0.4    Alkaline Phos 44 - 121 IU/L 71  81    AST 0 - 40 IU/L 20  19    ALT 0 - 44 IU/L 19  18        Latest Ref Rng & Units 06/22/2022    8:19 AM 12/09/2020    9:27 AM 11/25/2019    9:00 AM  CBC  WBC 3.4 - 10.8 x10E3/uL 8.2  8.3  8.4   Hemoglobin 13.0 - 17.7 g/dL 95.6  21.3  08.6   Hematocrit 37.5 - 51.0 % 42.2  46.2  46.5   Platelets 150 - 450 x10E3/uL 202  233  225    Lipid Panel Recent Labs    06/22/22 0819  CHOL 110  TRIG 69  LDLCALC 62  HDL 33*   TSH 5.290 11/08/2017  Radiology:   No results found.  Cardiac Studies:   Exercise myoview 11/08/12: 1.  EKG: Resting EKG showed A. Fibrillation, IRBBB, non specific ST and T changes. Stress EKG is negative for ischemia.  Patient exercised on Bruce protocol for 3 minutes 1 sec. The maximum work level achieved was 4.9 MET's. Accelerated heart rate with low level stress test. The test was terminated due to achievement of the target heart rate.  2. Perfusion images reveal a small sized inferior wall scar without significant superimposed ischemia. Dynamic gated images reveal mild decrease in Left ventricular ejection fraction which was estimated to be 49% 3. This represents a low risk study.  Echocardiogram 05/04/2022: Normal LV systolic function with visual EF 60-65%. Left ventricle cavity is normal in size. Mild concentric hypertrophy of the left ventricle. Normal global wall motion. Indeterminate diastolic filling pattern, elevated LAP. Calculated EF 62%. Left atrial cavity is moderate to severely dilated. Structurally normal tricuspid valve.  Mild tricuspid regurgitation. IVC is dilated with respiratory variation. no significant change compared to prior  EKG  EKG 12/28/2022: Atrial fibrillation with controlled ventricular  response at the rate of 77 bpm, left axis deviation, left intrafascicular block.  Low-voltage complexes.  Compared to 06/16/2022, no change.    Allergies & Meds  No Known Allergies   Current Outpatient Medications:    atorvastatin (LIPITOR) 20 MG tablet, TAKE 1 TABLET EVERY DAY, Disp: 90 tablet, Rfl: 10   buPROPion (WELLBUTRIN SR) 150 MG 12 hr tablet, TAKE 1 TABLET EVERY DAY, Disp: 90 tablet, Rfl: 3   diltiazem (CARDIZEM CD) 240 MG 24 hr capsule, TAKE 1 CAPSULE EVERY DAY, Disp: 90 capsule, Rfl: 3   metoprolol succinate (TOPROL-XL) 100 MG 24 hr tablet, TAKE 1/2 TABLET EVERY DAY, Disp: 45 tablet, Rfl: 3   valsartan-hydrochlorothiazide (DIOVAN-HCT) 320-25 MG tablet, TAKE 1 TABLET EVERY DAY, Disp: 90 tablet, Rfl: 10   XARELTO 20 MG TABS tablet, TAKE 1 TABLET EVERY DAY WITH SUPPER, Disp: 90 tablet, Rfl: 2   Assessment     ICD-10-CM   1. Permanent atrial fibrillation  I48.21 EKG 12-Lead    Ambulatory referral to Sleep Studies    2. Primary hypertension  I10     3. Hypercholesteremia  E78.00     4. Obstructive sleep apnea  G47.33 Ambulatory referral to Sleep Studies      CHA2DS2-VASc Score is 2.  Yearly risk of stroke: 2.3% (A, HTN).  Score of 1=0.6; 2=2.2; 3=3.2; 4=4.8; 5=7.2; 6=9.8; 7=>9.8) -(CHF; HTN; vasc disease DM,  Male = 1; Age <65 =0; 65-74 = 1,  >75 =2; stroke/embolism= 2).    No orders of the defined types were placed in this encounter.   There are no discontinued medications.   Orders Placed This Encounter  Procedures   Ambulatory referral to Sleep Studies    Referral Priority:   Routine    Referral Type:   Consultation    Referral Reason:   Specialty Services Required    Referred to Provider:   Huston Foley, MD    Number of Visits Requested:   1   EKG 12-Lead    No orders of the defined types were placed in this encounter.   Recommendations:   Michael Buck is a 72 y.o. Caucasian male with permanent atrial fibrillation, hypercholesterolemia, moderate obesity  and severe sleep apnea, colonic polyps presents for 33-month office visit.  1. Permanent atrial fibrillation Patient with permanent atrial fibrillation and remains rate controlled without clinical evidence of heart failure, fatigue or dyspnea.  He has low CHA2DS2-VASc score.  He is presently on Xarelto and is tolerating this without any bleeding diathesis, continue the same. - EKG 12-Lead  2. Primary hypertension Blood pressure under excellent control.  Presently on beta-blocker, diltiazem and valsartan which he is tolerating.  His labs including renal function has been normal.  3. Hypercholesteremia Lipids are well-controlled.  Continue atorvastatin 20 mg daily.  4. Obstructive sleep apnea Patient has a diagnosis severe obstructive sleep apnea.  He has a very old machine and he tried to obtain a new machine but was unsuccessful and has now completely stopped using CPAP.  I will make a referral for sleep study and to reevaluate his sleep needs.  He has previously seen Dr. Jetty Duhamel however patient wants to establish with new sleep specialist, hence referral will be sent to [facility]  Weight loss was discussed with the patient.  I will see him back in 6 months for follow-up.  Yates Decamp, AGNP-C 12/28/2022, 10:08 PM Office: [phone]

## 2023-05-11 ENCOUNTER — Other Ambulatory Visit: Payer: Self-pay | Admitting: Cardiology

## 2023-05-11 DIAGNOSIS — I480 Paroxysmal atrial fibrillation: Secondary | ICD-10-CM

## 2023-06-28 ENCOUNTER — Ambulatory Visit: Payer: Self-pay | Admitting: Cardiology

## 2023-08-08 ENCOUNTER — Ambulatory Visit: Payer: Medicare HMO | Admitting: Cardiology

## 2023-08-30 ENCOUNTER — Other Ambulatory Visit: Payer: Self-pay | Admitting: Cardiology

## 2023-08-30 DIAGNOSIS — I10 Essential (primary) hypertension: Secondary | ICD-10-CM

## 2023-11-30 ENCOUNTER — Ambulatory Visit: Payer: Medicare HMO | Admitting: Cardiology

## 2024-01-14 ENCOUNTER — Encounter: Payer: Self-pay | Admitting: Cardiology

## 2024-01-14 ENCOUNTER — Ambulatory Visit: Payer: Medicare HMO | Attending: Cardiology | Admitting: Cardiology

## 2024-01-14 VITALS — BP 100/72 | HR 91 | Resp 16 | Ht 73.0 in | Wt 265.0 lb

## 2024-01-14 DIAGNOSIS — I4821 Permanent atrial fibrillation: Secondary | ICD-10-CM | POA: Diagnosis not present

## 2024-01-14 DIAGNOSIS — E7800 Pure hypercholesterolemia, unspecified: Secondary | ICD-10-CM

## 2024-01-14 DIAGNOSIS — I10 Essential (primary) hypertension: Secondary | ICD-10-CM | POA: Diagnosis not present

## 2024-01-14 NOTE — Patient Instructions (Addendum)
 Medication Instructions:  Your physician recommends that you continue on your current medications as directed. Please refer to the Current Medication list given to you today.  *If you need a refill on your cardiac medications before your next appointment, please call your pharmacy*  Lab Work: At your earliest convenience please have labs drawn: CBC, CMP, Lipid panel You may go to any of these [facility] locations:   [facility] - [address] ([facility]) - [address] [phone] [address]   [place] - [address] - [address]   [place]   - [address]  If you have labs (blood work) drawn today and your tests are completely normal, you will receive your results only by: MyChart Message (if you have MyChart) OR A paper copy in the mail If you have any lab test that is abnormal or we need to change your treatment, we will call you to review the results.  Follow-Up: At [facility], you and your health needs are our priority.  As part of our continuing mission to provide you with exceptional heart care, our providers are all part of one team.  This team includes your primary Cardiologist (physician) and Advanced Practice Providers or APPs (Physician Assistants and Nurse Practitioners) who all work together to provide you with the care you need, when you need it.  Your next appointment:   1 year(s)  The format for your next appointment:   In Person  Provider:   Knox Perl, MD{  We recommend signing up for the patient portal called "MyChart".  Sign up information is provided on this After Visit Summary.  MyChart is used to connect with patients for Virtual Visits (Telemedicine).  Patients are able to view lab/test results, encounter notes, upcoming appointments, etc.  Non-urgent messages can be sent to your provider as well.   To learn more about what you can do with  MyChart, go to [email or web address].   Other Instructions Please establish with a primary care provider at: [facility] [address] [place],  [place]  [address]  Main: [phone]

## 2024-01-14 NOTE — Progress Notes (Signed)
 Cardiology Office Note:  .   Date:  01/14/2024  ID:  DAG ROHLOFF, DOB [birth year], MRN [id number] PCP: Patient, No Pcp Per  [facility] Providers Cardiologist:  Knox Perl, MD   History of Present Illness: .   TAVI ULMAN is a 73 y.o. Caucasian male with permanent atrial fibrillation, hypercholesterolemia, moderate obesity and severe sleep apnea not on CPAP, colonic polyps presents for annual visit.  He has had a normal echocardiogram with preserved LVEF at 60 to 65% in 2023 and remote myocardial perfusion scan in 2014 which had revealed small inferior scar without significant superimposed ischemia, EF 49%, considered low risk.  Discussed the use of AI scribe software for clinical note transcription with the patient, who gave verbal consent to proceed.  History of Present Illness COSTANTINO ROSSBERG is a 73 year old male who presents for a follow-up visit.  He experiences occasional dizziness when standing up quickly, which resolves after a few seconds. He has lost ten to twelve pounds and is interested in losing more weight. He is active, walking five miles a day and playing pickleball regularly. Despite experiencing popping sounds and a feeling of worn-out legs, he continues to walk extensively for work and recreation. He finds the cost of blood thinners financially burdensome and continues to work to afford his medication.  Labs   Lab Results  Component Value Date   CHOL 110 06/22/2022   HDL 33 (L) 06/22/2022   LDLCALC 62 06/22/2022   TRIG 69 06/22/2022   CHOLHDL 6.1 10/22/2012   Lab Results  Component Value Date   NA 142 12/09/2020   K 4.4 12/09/2020   CO2 22 12/09/2020   GLUCOSE 97 12/09/2020   BUN 22 12/09/2020   CREATININE 1.05 12/09/2020   CALCIUM 9.1 12/09/2020   EGFR 77 12/09/2020   GFRNONAA 76 11/25/2019      Latest Ref Rng & Units 12/09/2020    9:27 AM 11/25/2019    9:00 AM 12/03/2012    9:34 AM  BMP  Glucose 65 - 99 mg/dL 97  295  97   BUN 8 - 27  mg/dL 22  24  16    Creatinine 0.76 - 1.27 mg/dL 2.84  1.32  4.40   BUN/Creat Ratio 10 - 24 21  24     Sodium 134 - 144 mmol/L 142  142  141   Potassium 3.5 - 5.2 mmol/L 4.4  3.7  4.1   Chloride 96 - 106 mmol/L 102  106  106   CO2 20 - 29 mmol/L 22  21  24    Calcium 8.6 - 10.2 mg/dL 9.1  8.8  8.9       Latest Ref Rng & Units 06/22/2022    8:19 AM 12/09/2020    9:27 AM 11/25/2019    9:00 AM  CBC  WBC 3.4 - 10.8 x10E3/uL 8.2  8.3  8.4   Hemoglobin 13.0 - 17.7 g/dL 10.2  72.5  36.6   Hematocrit 37.5 - 51.0 % 42.2  46.2  46.5   Platelets 150 - 450 x10E3/uL 202  233  225    Lab Results  Component Value Date   HGBA1C  08/06/2007    5.7 (NOTE)   The ADA recommends the following therapeutic goals for glycemic   control related to Hgb A1C measurement:   Goal of Therapy:   < 7.0% Hgb A1C   Action Suggested:  > 8.0% Hgb A1C   Ref:  Diabetes Care, 22, Suppl. 1,  1999    Lab Results  Component Value Date   TSH 3.730 06/22/2022     ROS  Review of Systems  Cardiovascular:  Negative for chest pain, dyspnea on exertion and leg swelling.    Physical Exam:   VS:  BP 100/72 (BP Location: Left Arm, Patient Position: Sitting, Cuff Size: Large)   Pulse 91   Resp 16   Ht 6\' 1"  (1.854 m)   Wt 265 lb (120.2 kg)   SpO2 95%   BMI 34.96 kg/m    Wt Readings from Last 3 Encounters:  01/14/24 265 lb (120.2 kg)  12/28/22 259 lb 3.2 oz (117.6 kg)  12/15/22 271 lb (122.9 kg)    Physical Exam Neck:     Vascular: No carotid bruit or JVD.  Cardiovascular:     Rate and Rhythm: Normal rate. Rhythm irregular.     Pulses: Normal pulses and intact distal pulses.     Heart sounds: No murmur heard. Pulmonary:     Effort: Pulmonary effort is normal.     Breath sounds: Normal breath sounds.  Abdominal:     General: Bowel sounds are normal.     Palpations: Abdomen is soft.  Musculoskeletal:     Right lower leg: No edema.     Left lower leg: No edema.  Skin:    Capillary Refill: Capillary refill takes  less than 2 seconds.    Studies Reviewed: .    Echocardiogram 05/04/2022: Normal LV systolic function with visual EF 60-65%. Left ventricle cavity is normal in size. Mild concentric hypertrophy of the left ventricle. Normal global wall motion. Indeterminate diastolic filling pattern, elevated LAP. Calculated EF 62%. Left atrial cavity is moderate to severely dilated. Structurally normal tricuspid valve.  Mild tricuspid regurgitation. IVC is dilated with respiratory variation. no significant change compared to prior  EKG:    EKG Interpretation Date/Time:  Monday Jan 14 2024 16:12:05 EDT Ventricular Rate:  91 PR Interval:    QRS Duration:  86 QT Interval:  388 QTC Calculation: 477 R Axis:   -56  Text Interpretation: EKG 01/14/2024: Atrial fibrillation with controlled ventricular response at the rate of 91 bpm, left anterior fascicular block.  Compared to 12/28/2022, no significant change. Confirmed by Arleigh Odowd, Jagadeesh ([id number]) on 01/14/2024 5:08:58 PM    Medications and allergies    No Known Allergies   Current Outpatient Medications:    atorvastatin (LIPITOR) 20 MG tablet, TAKE 1 TABLET EVERY DAY, Disp: 60 tablet, Rfl: 2   buPROPion (WELLBUTRIN SR) 150 MG 12 hr tablet, TAKE 1 TABLET EVERY DAY, Disp: 90 tablet, Rfl: 3   diltiazem (CARDIZEM CD) 240 MG 24 hr capsule, TAKE 1 CAPSULE EVERY DAY, Disp: 90 capsule, Rfl: 3   metoprolol succinate (TOPROL-XL) 100 MG 24 hr tablet, TAKE 1/2 TABLET EVERY DAY, Disp: 45 tablet, Rfl: 3   valsartan -hydrochlorothiazide  (DIOVAN -HCT) 320-25 MG tablet, TAKE 1 TABLET EVERY DAY, Disp: 60 tablet, Rfl: 2   XARELTO  20 MG TABS tablet, TAKE 1 TABLET EVERY DAY WITH SUPPER, Disp: 90 tablet, Rfl: 3   No orders of the defined types were placed in this encounter.    There are no discontinued medications.   ASSESSMENT AND PLAN: .      ICD-10-CM   1. Permanent atrial fibrillation (HCC)  I48.21 EKG 12-Lead    Comprehensive metabolic panel with GFR    CBC    2.  Primary hypertension  I10     3. Hypercholesteremia  E78.00 Lipid panel  Assessment & Plan Permanent atrial fibrillation Patient presently on metoprolol succinate 50 mg daily and Xarelto  in view of elevated chads vascular score of 2.  Sleep apnea, not using CPAP   He is not using CPAP due to dissatisfaction with the current device and lack of perceived benefit. He is frustrated with the previous provider's inability to provide a new device. He reports no symptoms of excessive daytime sleepiness or other complications. Offer referral to another provider for CPAP evaluation if desired.  Primary hypertension Blood pressure is well-controlled on a combination of metoprolol succinate and diltiazem CD, continue the same.  Colon polyps   He has a history of colon polyps with the last colonoscopy in April 2024. Plan to repeat colonoscopy every three years due to previous findings of polyps. Schedule colonoscopy for April 2027.  Hypercholesterolemia and general Health Maintenance   He needs a referral for ongoing health maintenance, including prostate and cancer screenings. Refer to primary care provider in [place] with [facility] for routine screenings and health maintenance.  Check lipid profile testing along with CMP and CBC as annual labs.  Signed,  Knox Perl, MD, [facility] 01/14/2024, 8:58 PM [facility] [address] [place], [place] [address] Phone: [phone]. Fax:  [phone]

## 2024-02-02 ENCOUNTER — Other Ambulatory Visit: Payer: Self-pay | Admitting: Cardiology

## 2024-02-02 DIAGNOSIS — I10 Essential (primary) hypertension: Secondary | ICD-10-CM

## 2024-04-28 ENCOUNTER — Other Ambulatory Visit: Payer: Self-pay | Admitting: Cardiology

## 2024-05-03 ENCOUNTER — Other Ambulatory Visit: Payer: Self-pay | Admitting: Cardiology

## 2024-05-03 DIAGNOSIS — I480 Paroxysmal atrial fibrillation: Secondary | ICD-10-CM

## 2024-05-05 ENCOUNTER — Other Ambulatory Visit: Payer: Self-pay | Admitting: Cardiology

## 2024-06-26 ENCOUNTER — Emergency Department (HOSPITAL_COMMUNITY)

## 2024-06-26 ENCOUNTER — Emergency Department (HOSPITAL_COMMUNITY)
Admission: EM | Admit: 2024-06-26 | Discharge: 2024-06-26 | Disposition: A | Discharge status: Home / Self Care | Attending: Emergency Medicine | Admitting: Emergency Medicine

## 2024-06-26 DIAGNOSIS — S20212A Contusion of left front wall of thorax, initial encounter: Secondary | ICD-10-CM | POA: Diagnosis not present

## 2024-06-26 DIAGNOSIS — Z79899 Other long term (current) drug therapy: Secondary | ICD-10-CM | POA: Insufficient documentation

## 2024-06-26 DIAGNOSIS — I10 Essential (primary) hypertension: Secondary | ICD-10-CM | POA: Diagnosis not present

## 2024-06-26 DIAGNOSIS — I4891 Unspecified atrial fibrillation: Secondary | ICD-10-CM | POA: Diagnosis not present

## 2024-06-26 DIAGNOSIS — Z7901 Long term (current) use of anticoagulants: Secondary | ICD-10-CM | POA: Insufficient documentation

## 2024-06-26 DIAGNOSIS — S0990XA Unspecified injury of head, initial encounter: Secondary | ICD-10-CM | POA: Insufficient documentation

## 2024-06-26 DIAGNOSIS — R9082 White matter disease, unspecified: Secondary | ICD-10-CM | POA: Insufficient documentation

## 2024-06-26 DIAGNOSIS — R10819 Abdominal tenderness, unspecified site: Secondary | ICD-10-CM | POA: Insufficient documentation

## 2024-06-26 DIAGNOSIS — Y92410 Unspecified street and highway as the place of occurrence of the external cause: Secondary | ICD-10-CM | POA: Insufficient documentation

## 2024-06-26 DIAGNOSIS — S299XXA Unspecified injury of thorax, initial encounter: Secondary | ICD-10-CM | POA: Diagnosis present

## 2024-06-26 LAB — TYPE AND SCREEN
ABO/RH(D): A NEG
Antibody Screen: NEGATIVE

## 2024-06-26 LAB — COMPREHENSIVE METABOLIC PANEL WITH GFR
ALT: 22 U/L (ref 0–44)
AST: 27 U/L (ref 15–41)
Albumin: 3.7 g/dL (ref 3.5–5.0)
Alkaline Phosphatase: 62 U/L (ref 38–126)
Anion gap: 11 (ref 5–15)
BUN: 15 mg/dL (ref 8–23)
CO2: 25 mmol/L (ref 22–32)
Calcium: 8.5 mg/dL — ABNORMAL LOW (ref 8.9–10.3)
Chloride: 103 mmol/L (ref 98–111)
Creatinine, Ser: 0.98 mg/dL (ref 0.61–1.24)
GFR, Estimated: >60 mL/min (ref >60)
Glucose, Bld: 146 mg/dL — ABNORMAL HIGH (ref 70–99)
Potassium: 3.3 mmol/L — ABNORMAL LOW (ref 3.5–5.1)
Sodium: 139 mmol/L (ref 135–145)
Total Bilirubin: 0.8 mg/dL (ref 0.0–1.2)
Total Protein: 6.4 g/dL — ABNORMAL LOW (ref 6.5–8.1)

## 2024-06-26 LAB — CBC WITH DIFFERENTIAL/PLATELET
Abs Immature Granulocytes: 0.08 K/uL — ABNORMAL HIGH (ref 0.00–0.07)
Basophils Absolute: 0 K/uL (ref 0.0–0.1)
Basophils Relative: 0 %
Eosinophils Absolute: 0.3 K/uL (ref 0.0–0.5)
Eosinophils Relative: 3 %
HCT: 43.9 % (ref 39.0–52.0)
Hemoglobin: 14.9 g/dL (ref 13.0–17.0)
Immature Granulocytes: 1 %
Lymphocytes Relative: 23 %
Lymphs Abs: 2.5 K/uL (ref 0.7–4.0)
MCH: 30.2 pg (ref 26.0–34.0)
MCHC: 33.9 g/dL (ref 30.0–36.0)
MCV: 88.9 fL (ref 80.0–100.0)
Monocytes Absolute: 1.1 K/uL — ABNORMAL HIGH (ref 0.1–1.0)
Monocytes Relative: 11 %
Neutro Abs: 6.7 K/uL (ref 1.7–7.7)
Neutrophils Relative %: 62 %
Platelets: 243 K/uL (ref 150–400)
RBC: 4.94 MIL/uL (ref 4.22–5.81)
RDW: 13.3 % (ref 11.5–15.5)
WBC: 10.7 K/uL — ABNORMAL HIGH (ref 4.0–10.5)
nRBC: 0 % (ref 0.0–0.2)

## 2024-06-26 LAB — APTT: aPTT: 40 s — ABNORMAL HIGH (ref 24–36)

## 2024-06-26 LAB — I-STAT CHEM 8, ED
BUN: 17 mg/dL (ref 8–23)
Calcium, Ion: 1.09 mmol/L — ABNORMAL LOW (ref 1.15–1.40)
Chloride: 102 mmol/L (ref 98–111)
Creatinine, Ser: 1 mg/dL (ref 0.61–1.24)
Glucose, Bld: 142 mg/dL — ABNORMAL HIGH (ref 70–99)
HCT: 43 % (ref 39.0–52.0)
Hemoglobin: 14.6 g/dL (ref 13.0–17.0)
Potassium: 3.4 mmol/L — ABNORMAL LOW (ref 3.5–5.1)
Sodium: 143 mmol/L (ref 135–145)
TCO2: 24 mmol/L (ref 22–32)

## 2024-06-26 LAB — I-STAT CG4 LACTIC ACID, ED: Lactic Acid, Venous: 1.7 mmol/L (ref 0.5–1.9)

## 2024-06-26 LAB — PROTIME-INR
INR: 2.1 — ABNORMAL HIGH (ref 0.8–1.2)
Prothrombin Time: 24.5 s — ABNORMAL HIGH (ref 11.4–15.2)

## 2024-06-26 MED ORDER — ACETAMINOPHEN 500 MG PO TABS
1000.0000 mg | ORAL_TABLET | Freq: Once | ORAL | Status: AC
  Administered 2024-06-26: 1000 mg via ORAL
  Filled 2024-06-26: qty 2

## 2024-06-26 MED ORDER — SODIUM CHLORIDE 0.9 % IV BOLUS
1000.0000 mL | Freq: Once | INTRAVENOUS | Status: AC
  Administered 2024-06-26: 1000 mL via INTRAVENOUS

## 2024-06-26 MED ORDER — METHOCARBAMOL 500 MG PO TABS
500.0000 mg | ORAL_TABLET | Freq: Once | ORAL | Status: AC
  Administered 2024-06-26: 500 mg via ORAL
  Filled 2024-06-26: qty 1

## 2024-06-26 MED ORDER — METHOCARBAMOL 500 MG PO TABS: 500.0000 mg | ORAL_TABLET | Freq: Three times a day (TID) | ORAL | 0 refills | Status: AC | PRN

## 2024-06-26 MED ORDER — IOHEXOL 350 MG/ML SOLN
100.0000 mL | Freq: Once | INTRAVENOUS | Status: AC | PRN
  Administered 2024-06-26: 100 mL via INTRAVENOUS

## 2024-06-26 MED ORDER — HYDROMORPHONE HCL 1 MG/ML IJ SOLN
0.5000 mg | Freq: Once | INTRAMUSCULAR | Status: AC
  Administered 2024-06-26: 0.5 mg via INTRAVENOUS
  Filled 2024-06-26: qty 1
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
## 2024-06-26 NOTE — Discharge Instructions (Signed)
 For pain control you may take 1000 mg of Tylenol every 8 hours as needed.  Incentive spirometer: Use frequently to prevent pneumonia.  Recommended usage: 10 deep inhalations through spirometer every 2-3 hours for 7-10 days.
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
## 2024-06-26 NOTE — ED Notes (Signed)
 Pt transported to CT after Dr. Trine was finished with the bedside FAST.
